# Patient Record
Sex: Female | Born: 1945 | Race: Black or African American | Hispanic: No | Smoking: Former smoker
Health system: Southern US, Community
[De-identification: ages and names within clinical notes are randomized; demographics above are authoritative.]

## PROBLEM LIST (undated history)

## (undated) DIAGNOSIS — Z860100 Personal history of colon polyps, unspecified: Secondary | ICD-10-CM

## (undated) DIAGNOSIS — F319 Bipolar disorder, unspecified: Secondary | ICD-10-CM

## (undated) DIAGNOSIS — J45909 Unspecified asthma, uncomplicated: Secondary | ICD-10-CM

## (undated) DIAGNOSIS — K5790 Diverticulosis of intestine, part unspecified, without perforation or abscess without bleeding: Secondary | ICD-10-CM

## (undated) DIAGNOSIS — F419 Anxiety disorder, unspecified: Secondary | ICD-10-CM

## (undated) DIAGNOSIS — E785 Hyperlipidemia, unspecified: Secondary | ICD-10-CM

## (undated) DIAGNOSIS — I10 Essential (primary) hypertension: Secondary | ICD-10-CM

## (undated) HISTORY — PX: FOOT SURGERY: SHX648

## (undated) HISTORY — PX: ABDOMINAL HYSTERECTOMY: SHX81

---

## 1982-11-04 HISTORY — PX: BREAST CYST ASPIRATION: SHX578

## 2003-12-09 ENCOUNTER — Other Ambulatory Visit: Payer: Self-pay

## 2004-12-31 ENCOUNTER — Ambulatory Visit: Payer: Self-pay | Admitting: Internal Medicine

## 2006-01-01 ENCOUNTER — Ambulatory Visit: Payer: Self-pay | Admitting: Internal Medicine

## 2006-12-15 ENCOUNTER — Ambulatory Visit: Payer: Self-pay | Admitting: Internal Medicine

## 2007-01-06 ENCOUNTER — Ambulatory Visit: Payer: Self-pay | Admitting: Internal Medicine

## 2007-11-20 ENCOUNTER — Ambulatory Visit: Payer: Self-pay | Admitting: Gastroenterology

## 2008-01-08 ENCOUNTER — Ambulatory Visit: Payer: Self-pay | Admitting: Internal Medicine

## 2009-01-09 ENCOUNTER — Ambulatory Visit: Payer: Self-pay | Admitting: Internal Medicine

## 2009-08-16 ENCOUNTER — Inpatient Hospital Stay: Payer: Self-pay | Admitting: Internal Medicine

## 2010-01-11 ENCOUNTER — Ambulatory Visit: Payer: Self-pay | Admitting: Internal Medicine

## 2010-07-02 ENCOUNTER — Ambulatory Visit: Payer: Self-pay

## 2010-11-27 ENCOUNTER — Ambulatory Visit: Payer: Self-pay | Admitting: Gastroenterology

## 2010-11-29 LAB — PATHOLOGY REPORT

## 2011-01-15 ENCOUNTER — Ambulatory Visit: Payer: Self-pay | Admitting: Internal Medicine

## 2011-04-08 ENCOUNTER — Emergency Department: Payer: Self-pay | Admitting: Unknown Physician Specialty

## 2011-04-10 ENCOUNTER — Ambulatory Visit: Payer: Self-pay | Admitting: Internal Medicine

## 2011-05-24 ENCOUNTER — Ambulatory Visit: Payer: Self-pay | Admitting: Family Medicine

## 2012-01-16 ENCOUNTER — Ambulatory Visit: Payer: Self-pay | Admitting: Internal Medicine

## 2013-01-19 ENCOUNTER — Ambulatory Visit: Payer: Self-pay | Admitting: Internal Medicine

## 2013-05-31 ENCOUNTER — Ambulatory Visit: Payer: Self-pay | Admitting: Anesthesiology

## 2013-06-03 ENCOUNTER — Ambulatory Visit: Payer: Self-pay | Admitting: Podiatry

## 2013-06-05 ENCOUNTER — Emergency Department: Payer: Self-pay | Admitting: Emergency Medicine

## 2013-06-05 LAB — COMPREHENSIVE METABOLIC PANEL
Anion Gap: 8 (ref 7–16)
BUN: 16 mg/dL (ref 7–18)
Bilirubin,Total: 0.4 mg/dL (ref 0.2–1.0)
Calcium, Total: 9.1 mg/dL (ref 8.5–10.1)
Co2: 26 mmol/L (ref 21–32)
EGFR (African American): >60
EGFR (Non-African Amer.): >60
Glucose: 86 mg/dL (ref 65–99)
Potassium: 3.4 mmol/L — ABNORMAL LOW (ref 3.5–5.1)
SGOT(AST): 19 U/L (ref 15–37)
Sodium: 140 mmol/L (ref 136–145)

## 2013-06-05 LAB — CBC
MCHC: 34.9 g/dL (ref 32.0–36.0)
MCV: 91 fL (ref 80–100)
RBC: 4.4 10*6/uL (ref 3.80–5.20)
RDW: 13.2 % (ref 11.5–14.5)
WBC: 14.1 10*3/uL — ABNORMAL HIGH (ref 3.6–11.0)

## 2013-06-05 LAB — URINALYSIS, COMPLETE
RBC,UR: 1287 /HPF (ref 0–5)
Specific Gravity: 1.011 (ref 1.003–1.030)
Squamous Epithelial: NONE SEEN
WBC UR: 73 /HPF (ref 0–5)

## 2013-06-07 LAB — URINE CULTURE

## 2014-01-21 ENCOUNTER — Ambulatory Visit: Payer: Self-pay | Admitting: Internal Medicine

## 2014-02-19 ENCOUNTER — Emergency Department: Payer: Self-pay | Admitting: Emergency Medicine

## 2014-03-27 ENCOUNTER — Emergency Department: Payer: Self-pay | Admitting: Emergency Medicine

## 2014-03-27 LAB — COMPREHENSIVE METABOLIC PANEL
ALBUMIN: 3.6 g/dL (ref 3.4–5.0)
ALK PHOS: 104 U/L
ANION GAP: 6 — AB (ref 7–16)
AST: 27 U/L (ref 15–37)
BUN: 17 mg/dL (ref 7–18)
Bilirubin,Total: 0.4 mg/dL (ref 0.2–1.0)
CHLORIDE: 105 mmol/L (ref 98–107)
Calcium, Total: 9 mg/dL (ref 8.5–10.1)
Co2: 27 mmol/L (ref 21–32)
Creatinine: 1 mg/dL (ref 0.60–1.30)
EGFR (African American): >60
GFR CALC NON AF AMER: 58 — AB
Glucose: 128 mg/dL — ABNORMAL HIGH (ref 65–99)
Osmolality: 279 (ref 275–301)
POTASSIUM: 3.6 mmol/L (ref 3.5–5.1)
SGPT (ALT): 28 U/L (ref 12–78)
SODIUM: 138 mmol/L (ref 136–145)
TOTAL PROTEIN: 7 g/dL (ref 6.4–8.2)

## 2014-03-27 LAB — CBC WITH DIFFERENTIAL/PLATELET
BASOS PCT: 0.7 %
Basophil #: 0 10*3/uL (ref 0.0–0.1)
EOS ABS: 0.4 10*3/uL (ref 0.0–0.7)
Eosinophil %: 6.2 %
HCT: 40.6 % (ref 35.0–47.0)
HGB: 13.7 g/dL (ref 12.0–16.0)
LYMPHS ABS: 1.7 10*3/uL (ref 1.0–3.6)
LYMPHS PCT: 25.4 %
MCH: 31.4 pg (ref 26.0–34.0)
MCHC: 33.8 g/dL (ref 32.0–36.0)
MCV: 93 fL (ref 80–100)
MONOS PCT: 11 %
Monocyte #: 0.7 x10 3/mm (ref 0.2–0.9)
NEUTROS PCT: 56.7 %
Neutrophil #: 3.8 10*3/uL (ref 1.4–6.5)
Platelet: 279 10*3/uL (ref 150–440)
RBC: 4.37 10*6/uL (ref 3.80–5.20)
RDW: 12.9 % (ref 11.5–14.5)
WBC: 6.8 10*3/uL (ref 3.6–11.0)

## 2014-03-27 LAB — URINALYSIS, COMPLETE
Bilirubin,UR: NEGATIVE
Blood: NEGATIVE
Glucose,UR: NEGATIVE mg/dL (ref 0–75)
KETONE: NEGATIVE
Nitrite: NEGATIVE
Ph: 6 (ref 4.5–8.0)
Protein: NEGATIVE
RBC,UR: <1 /HPF (ref 0–5)
SPECIFIC GRAVITY: 1.002 (ref 1.003–1.030)

## 2014-03-29 ENCOUNTER — Emergency Department: Payer: Self-pay | Admitting: Emergency Medicine

## 2014-03-30 ENCOUNTER — Emergency Department: Payer: Self-pay | Admitting: Emergency Medicine

## 2014-03-30 LAB — BASIC METABOLIC PANEL
Anion Gap: 5 — ABNORMAL LOW (ref 7–16)
BUN: 20 mg/dL — AB (ref 7–18)
CALCIUM: 9.3 mg/dL (ref 8.5–10.1)
Chloride: 111 mmol/L — ABNORMAL HIGH (ref 98–107)
Co2: 25 mmol/L (ref 21–32)
Creatinine: 0.84 mg/dL (ref 0.60–1.30)
EGFR (Non-African Amer.): >60
Glucose: 142 mg/dL — ABNORMAL HIGH (ref 65–99)
Osmolality: 286 (ref 275–301)
POTASSIUM: 3.6 mmol/L (ref 3.5–5.1)
Sodium: 141 mmol/L (ref 136–145)

## 2014-03-30 LAB — CBC
HCT: 41.8 % (ref 35.0–47.0)
HGB: 14.2 g/dL (ref 12.0–16.0)
MCH: 31.6 pg (ref 26.0–34.0)
MCHC: 34.1 g/dL (ref 32.0–36.0)
MCV: 93 fL (ref 80–100)
Platelet: 290 10*3/uL (ref 150–440)
RBC: 4.5 10*6/uL (ref 3.80–5.20)
RDW: 13 % (ref 11.5–14.5)
WBC: 10.4 10*3/uL (ref 3.6–11.0)

## 2014-03-30 LAB — TROPONIN I: Troponin-I: <0.02 ng/mL

## 2014-09-05 ENCOUNTER — Ambulatory Visit: Payer: Self-pay | Admitting: Gastroenterology

## 2015-03-01 ENCOUNTER — Ambulatory Visit
Admit: 2015-03-01 | Disposition: A | Discharge status: Home / Self Care | Payer: Self-pay | Attending: Internal Medicine | Admitting: Internal Medicine

## 2015-05-28 IMAGING — CT CT ABD-PELV W/O CM
1 of 2 series · 15 of 32 positions shown, 19 images · non-contrast
Comparison: none

REASON FOR EXAM: (1) flank pain, hematuria; (2) flank pain, hematuria
COMMENTS:

PROCEDURE:     CT  - CT ABDOMEN AND PELVIS W[DATE] [DATE]
RESULT:
TECHNIQUE: Helical noncontrast 3 mm sections were obtained from the lung
bases through the pubic symphysis.

[Series 2: 3mm soft tissue · axial · 0.79mm/px · z∈[-434,-48]mm · 15 of 141 slices shown, 19 images]
[im 6/141  soft-tissue]
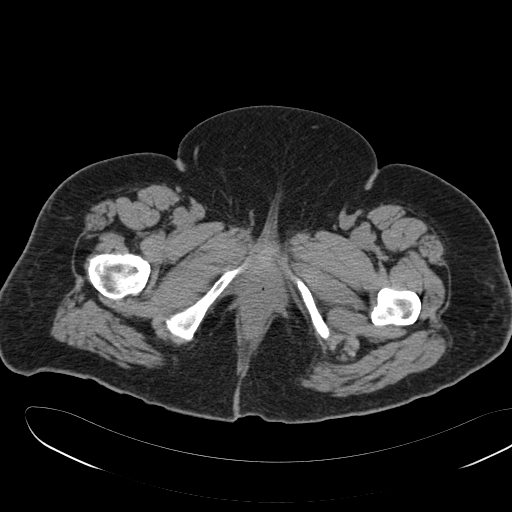
[im 6/141  bone]
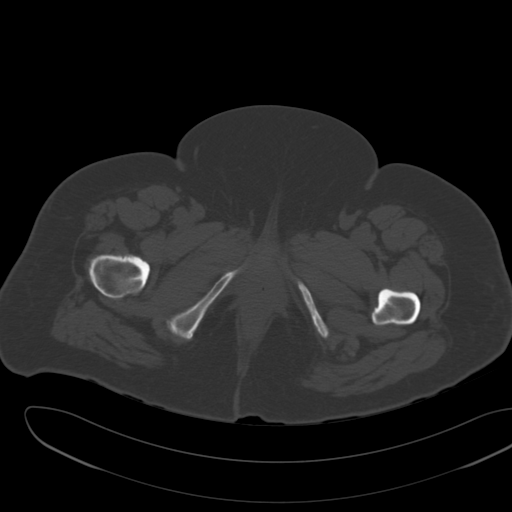
[im 18/141  soft-tissue]
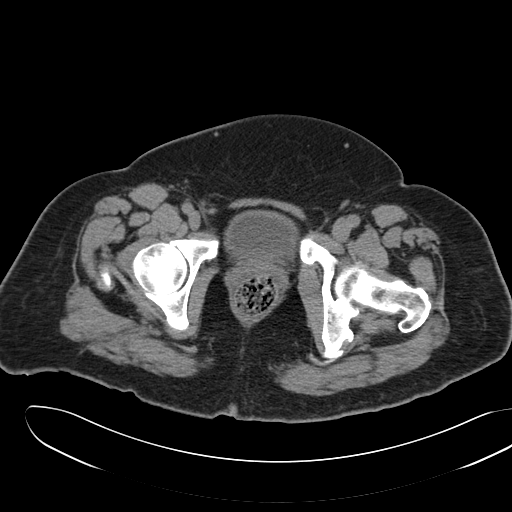
[im 30/141  soft-tissue]
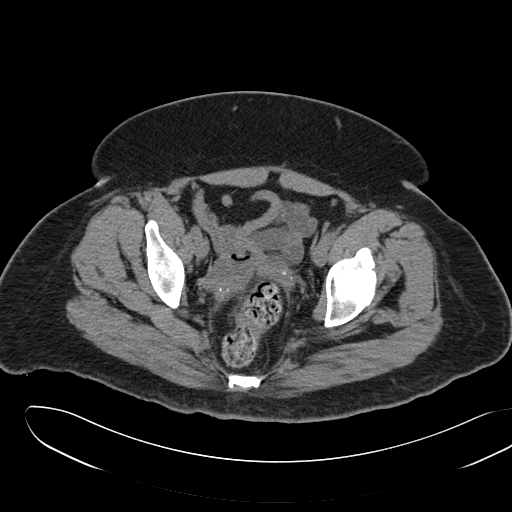
[im 41/141  soft-tissue]
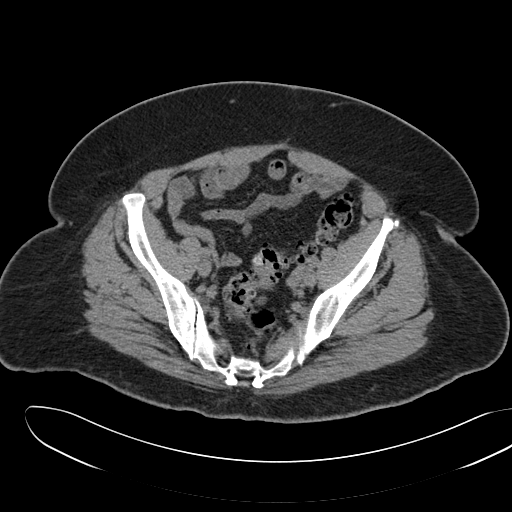
[im 47/141  soft-tissue]
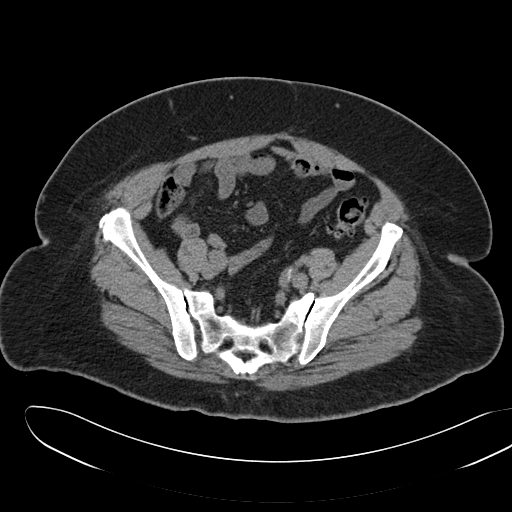
[im 59/141  soft-tissue]
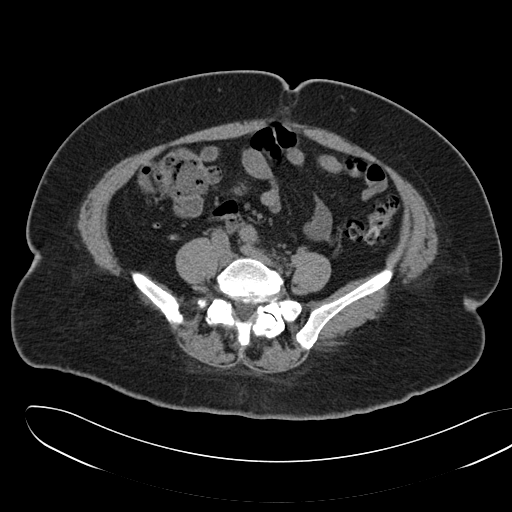
[im 71/141  soft-tissue]
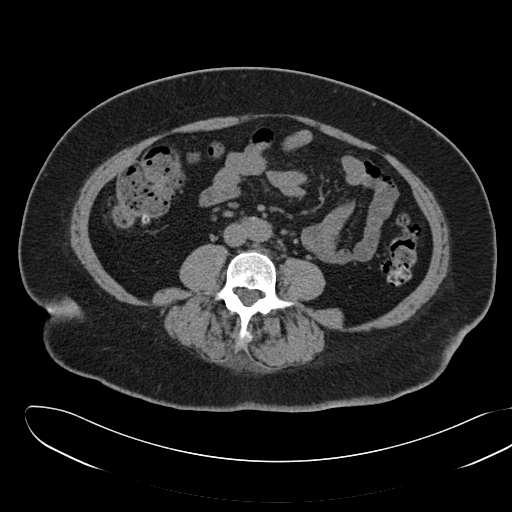
[im 82/141  soft-tissue]
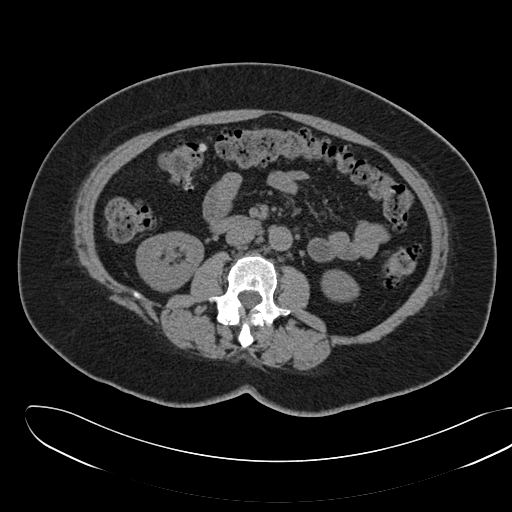
[im 94/141  soft-tissue]
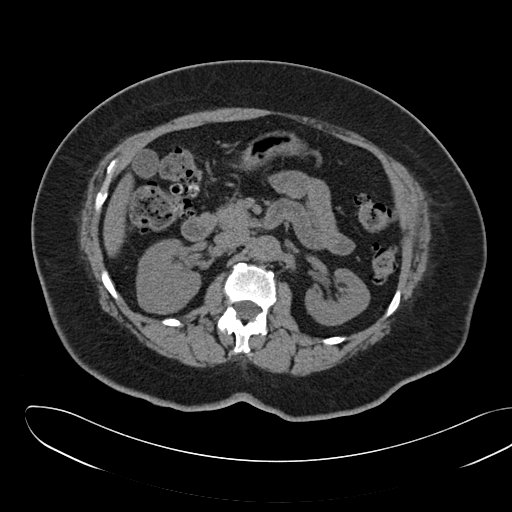
[im 94/141  bone]
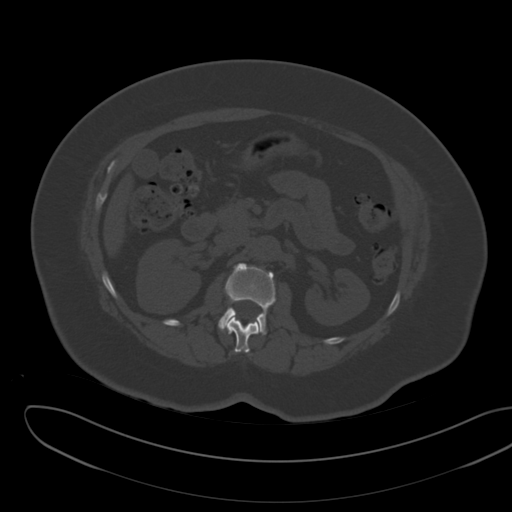
[im 100/141  soft-tissue]
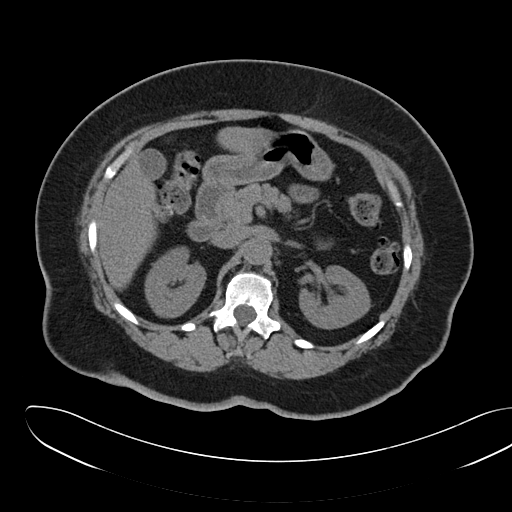
[im 111/141  soft-tissue]
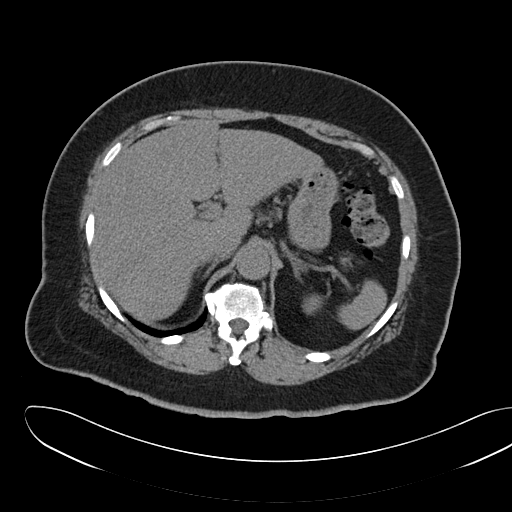
[im 117/141  lung]
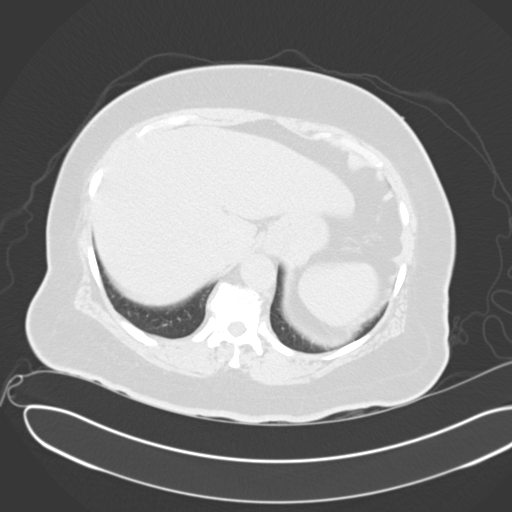
[im 123/141  soft-tissue]
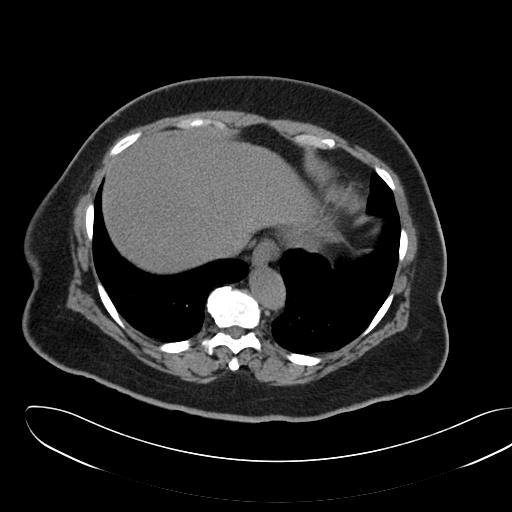
[im 123/141  lung]
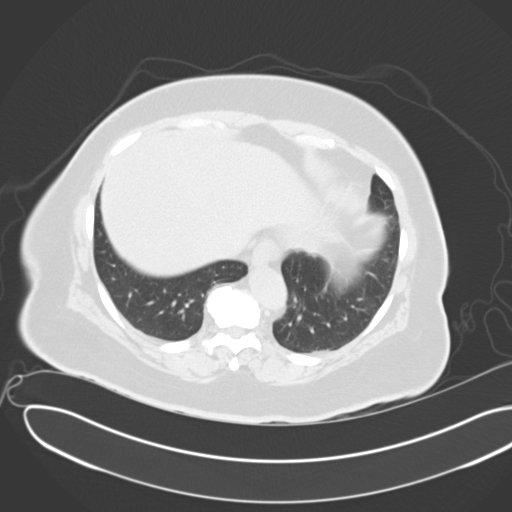
[im 129/141  lung]
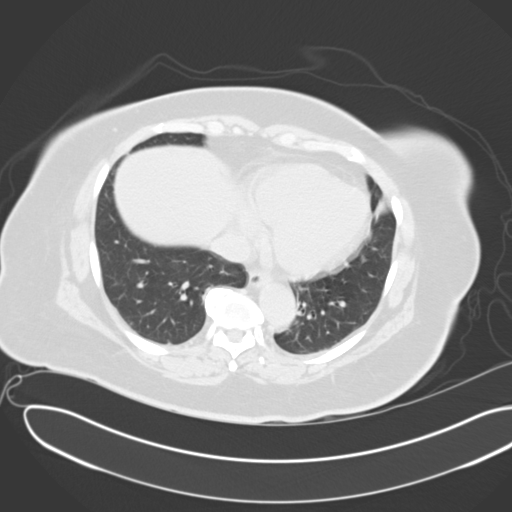
[im 135/141  soft-tissue]
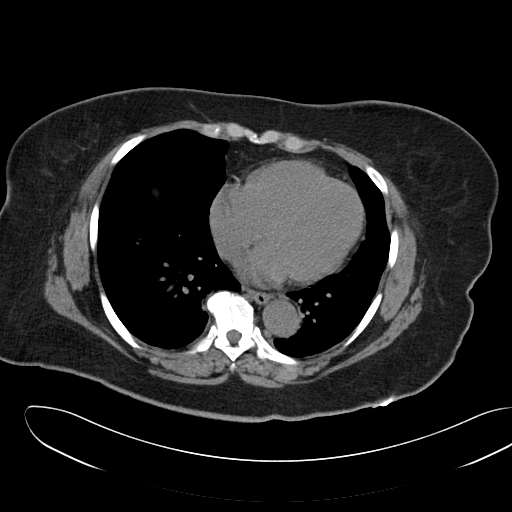
[im 135/141  lung]
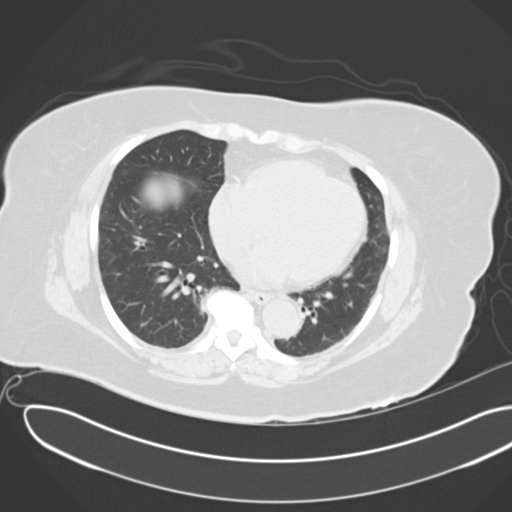

[15 of 32 positions shown; findings below may reference images not displayed]

Within the base of the lingula, a mild area of increased density is
appreciated. Differential considerations are atelectasis versus infiltrate.
A small, amorphous nodular component is appreciated and surveillance
evaluation is recommended.

Noncontrast evaluation of the liver demonstrates mild diffuse low
attenuating architecture. The spleen, adrenals, and pancreas are
unremarkable. Evaluation of the kidneys demonstrates no evidence of
hydronephrosis, hydroureter or ureterolithiasis. There is no evidence of
bowel obstruction, enteritis, colitis or diverticulitis. There is
diverticulosis within the colon. The urinary bladder is partially
decompressed though there is mild diffuse bladder wall thickening.

There is no evidence of bowel obstruction, masses, adenopathy, free fluid or
loculated fluid collections within the limitations of a noncontrast CT. The
appendix is identified and is unremarkable. There is no evidence of an
abdominal aortic aneurysm.
IMPRESSION: 1.  No CT evidence of renal calculus disease or obstructive uropathy.
2.  Diverticulosis without evidence of diverticulitis.
3.  Findings likely reflecting hepatic steatosis.
4.  Moderate amount of fecal retention.
5.  Dr. Jean of the Emergency Department was informed of these findings
via a preliminary faxed report.

## 2016-06-10 ENCOUNTER — Other Ambulatory Visit: Payer: Self-pay | Admitting: Internal Medicine

## 2016-06-10 DIAGNOSIS — Z1231 Encounter for screening mammogram for malignant neoplasm of breast: Secondary | ICD-10-CM

## 2016-06-24 ENCOUNTER — Ambulatory Visit
Admission: RE | Admit: 2016-06-24 | Discharge: 2016-06-24 | Disposition: A | Discharge status: Home / Self Care | Payer: Medicare Other | Source: Ambulatory Visit | Attending: Internal Medicine | Admitting: Internal Medicine

## 2016-06-24 ENCOUNTER — Other Ambulatory Visit: Payer: Self-pay | Admitting: Internal Medicine

## 2016-06-24 DIAGNOSIS — Z1231 Encounter for screening mammogram for malignant neoplasm of breast: Secondary | ICD-10-CM

## 2017-06-30 ENCOUNTER — Other Ambulatory Visit: Payer: Self-pay | Admitting: Internal Medicine

## 2017-06-30 DIAGNOSIS — Z1231 Encounter for screening mammogram for malignant neoplasm of breast: Secondary | ICD-10-CM

## 2017-07-16 ENCOUNTER — Ambulatory Visit
Admission: RE | Admit: 2017-07-16 | Discharge: 2017-07-16 | Disposition: A | Discharge status: Home / Self Care | Payer: Medicare Other | Source: Ambulatory Visit | Attending: Internal Medicine | Admitting: Internal Medicine

## 2017-07-16 DIAGNOSIS — Z1231 Encounter for screening mammogram for malignant neoplasm of breast: Secondary | ICD-10-CM

## 2017-11-22 ENCOUNTER — Other Ambulatory Visit: Payer: Self-pay

## 2017-11-22 ENCOUNTER — Emergency Department
Admission: EM | Admit: 2017-11-22 | Discharge: 2017-11-22 | Disposition: A | Discharge status: Home / Self Care | Payer: Medicare Other | Attending: Emergency Medicine | Admitting: Emergency Medicine

## 2017-11-22 ENCOUNTER — Encounter: Payer: Self-pay | Admitting: Emergency Medicine

## 2017-11-22 DIAGNOSIS — I10 Essential (primary) hypertension: Secondary | ICD-10-CM | POA: Insufficient documentation

## 2017-11-22 DIAGNOSIS — N3001 Acute cystitis with hematuria: Secondary | ICD-10-CM | POA: Insufficient documentation

## 2017-11-22 DIAGNOSIS — R300 Dysuria: Secondary | ICD-10-CM | POA: Diagnosis present

## 2017-11-22 HISTORY — DX: Essential (primary) hypertension: I10

## 2017-11-22 LAB — URINALYSIS, COMPLETE (UACMP) WITH MICROSCOPIC: Specific Gravity, Urine: 1.006 (ref 1.005–1.030)

## 2017-11-22 MED ORDER — FLUCONAZOLE 150 MG PO TABS: 150.0000 mg | ORAL_TABLET | Freq: Once | ORAL | 0 refills | Status: AC

## 2017-11-22 MED ORDER — LIDOCAINE HCL (PF) 1 % IJ SOLN
2.1000 mL | Freq: Once | INTRAMUSCULAR | Status: AC
  Administered 2017-11-22: 2.1 mL
  Filled 2017-11-22: qty 5

## 2017-11-22 MED ORDER — PHENAZOPYRIDINE HCL 95 MG PO TABS: 95.0000 mg | ORAL_TABLET | Freq: Three times a day (TID) | ORAL | 0 refills | Status: DC | PRN

## 2017-11-22 MED ORDER — LEVOFLOXACIN 750 MG PO TABS
750.0000 mg | ORAL_TABLET | Freq: Once | ORAL | Status: AC
  Administered 2017-11-22: 750 mg via ORAL
  Filled 2017-11-22: qty 1

## 2017-11-22 MED ORDER — CEFTRIAXONE SODIUM 1 G IJ SOLR
1.0000 g | Freq: Once | INTRAMUSCULAR | Status: AC
  Administered 2017-11-22: 1 g via INTRAMUSCULAR
  Filled 2017-11-22: qty 10

## 2017-11-22 MED ORDER — LEVOFLOXACIN 750 MG PO TABS: 750.0000 mg | ORAL_TABLET | Freq: Every day | ORAL | 0 refills | Status: AC

## 2017-11-22 NOTE — ED Notes (Signed)
Pt says she thinks she has a UTI; admits to hematuria; ambulatory with steady gait;

## 2017-11-22 NOTE — ED Notes (Signed)
Pt tried to urinate but was unable to give enough for UA.

## 2017-11-22 NOTE — ED Provider Notes (Signed)
[facility] Emergency Department Provider Note  ____________________________________________  Time seen: Approximately 9:48 PM  I have reviewed the triage vital signs and the nursing notes.   HISTORY  Chief Complaint Urinary Tract Infection    HPI Tara Cabrera is a 72 y.o. female who presents the emergency department complaining of suprapubic, lower back pain, dysuria, polyuria, hematuria.  Patient reports that she has an infrequent history of UTIs but the past UTIs presented in similar symptoms.  Patient denies any history of kidney stone.  She reports that lower back pain is in the "very lower back"but denies frank flank pain.  Pain is bilaterally.  Patient denies any frank abdominal pain, nausea vomiting, diarrhea or constipation.  Patient is not tried any medications for this complaint prior to arrival.  Symptoms have been ongoing for several days.  Patient reports that she does have frank hematuria, dysuria is described as burning, lower back and suprapubic pain is described as a burning/sensation.  Past Medical History:  Diagnosis Date  . Hypertension     There are no active problems to display for this patient.   Past Surgical History:  Procedure Laterality Date  . ABDOMINAL HYSTERECTOMY    . BREAST CYST ASPIRATION Right 1984  . FOOT SURGERY      Prior to Admission medications   Medication Sig Start Date End Date Taking? Authorizing Provider  levofloxacin (LEVAQUIN) 750 MG tablet Take 1 tablet (750 mg total) by mouth daily for 7 days. 11/22/17 11/29/17  Kealey Kemmer, Charline Bills, PA-C  phenazopyridine (PYRIDIUM) 95 MG tablet Take 1 tablet (95 mg total) by mouth 3 (three) times daily as needed for pain. 11/22/17   Kharis Lapenna, Charline Bills, PA-C    Allergies Codeine and Penicillins  Family History  Problem Relation Age of Onset  . Breast cancer Mother 27  . Breast cancer Maternal Aunt 68    Social History Social History   Tobacco Use  . Smoking  status: Never Smoker  . Smokeless tobacco: Never Used  Substance Use Topics  . Alcohol use: Yes    Frequency: Never    Comment: rarely  . Drug use: No     Review of Systems  Constitutional: No fever/chills Eyes: No visual changes. No discharge ENT: No upper respiratory complaints. Cardiovascular: no chest pain. Respiratory: no cough. No SOB. Gastrointestinal: Positive for suprapubic pain.  No abdominal pain.  No nausea, no vomiting.  No diarrhea.  No constipation. Genitourinary: As noted for dysuria, hematuria, polyuria Musculoskeletal: Negative for musculoskeletal pain. Skin: Negative for rash, abrasions, lacerations, ecchymosis. Neurological: Negative for headaches, focal weakness or numbness. 10-point ROS otherwise negative.  ____________________________________________   PHYSICAL EXAM:  VITAL SIGNS: ED Triage Vitals [11/22/17 2039]  Enc Vitals Group     BP (!) 147/100     Pulse Rate 70     Resp 20     Temp 98.4 F (36.9 C)     Temp Source Oral     SpO2 98 %     Weight 183 lb (83 kg)     Height 5\' 2"  (1.575 m)     Head Circumference      Peak Flow      Pain Score 8     Pain Loc      Pain Edu?      Excl. in [place]?      Constitutional: Alert and oriented. Well appearing and in no acute distress. Eyes: Conjunctivae are normal. PERRL. EOMI. Head: Atraumatic. Neck: No stridor.  Cardiovascular: Normal rate, regular rhythm. Normal S1 and S2.  Good peripheral circulation. Respiratory: Normal respiratory effort without tachypnea or retractions. Lungs CTAB. Good air entry to the bases with no decreased or absent breath sounds. Gastrointestinal: Bowel sounds 4 quadrants. Soft and nontender to palpation. No guarding or rigidity. No palpable masses. No distention. No CVA tenderness. Musculoskeletal: Full range of motion to all extremities. No gross deformities appreciated. Neurologic:  Normal speech and language. No gross focal neurologic deficits are appreciated.   Skin:  Skin is warm, dry and intact. No rash noted. Psychiatric: Mood and affect are normal. Speech and behavior are normal. Patient exhibits appropriate insight and judgement.   ____________________________________________   LABS (all labs ordered are listed, but only abnormal results are displayed)  Labs Reviewed  URINALYSIS, COMPLETE (UACMP) WITH MICROSCOPIC - Abnormal; Notable for the following components:      Result Value   Color, Urine RED (*)    APPearance HAZY (*)    Glucose, UA   (*)    Value: TEST NOT REPORTED DUE TO COLOR INTERFERENCE OF URINE PIGMENT   Hgb urine dipstick   (*)    Value: TEST NOT REPORTED DUE TO COLOR INTERFERENCE OF URINE PIGMENT   Bilirubin Urine   (*)    Value: TEST NOT REPORTED DUE TO COLOR INTERFERENCE OF URINE PIGMENT   Ketones, ur   (*)    Value: TEST NOT REPORTED DUE TO COLOR INTERFERENCE OF URINE PIGMENT   Protein, ur   (*)    Value: TEST NOT REPORTED DUE TO COLOR INTERFERENCE OF URINE PIGMENT   Nitrite   (*)    Value: TEST NOT REPORTED DUE TO COLOR INTERFERENCE OF URINE PIGMENT   Leukocytes, UA   (*)    Value: TEST NOT REPORTED DUE TO COLOR INTERFERENCE OF URINE PIGMENT   All other components within normal limits   ____________________________________________  EKG   ____________________________________________  RADIOLOGY   No results found.  ____________________________________________    PROCEDURES  Procedure(s) performed:    Procedures    Medications  cefTRIAXone (ROCEPHIN) injection 1 g (1 g Intramuscular Given 11/22/17 2252)  lidocaine (PF) (XYLOCAINE) 1 % injection 2.1 mL (2.1 mLs Infiltration Given 11/22/17 2257)  levofloxacin (LEVAQUIN) tablet 750 mg (750 mg Oral Given 11/22/17 2252)     ____________________________________________   INITIAL IMPRESSION / ASSESSMENT AND PLAN / ED COURSE  Pertinent labs & imaging results that were available during my care of the patient were reviewed by me and considered in  my medical decision making (see chart for details).  Review of the [place] CSRS was performed in accordance of the [place] prior to dispensing any controlled drugs.     Patient's diagnosis is consistent with cystitis with hematuria.  Patient presented with dysuria, polyuria, hematuria.  No history of kidney stones.  Initial differential included cystitis, UTI, pyelonephritis, nephrolithiasis.  Patient's urinalysis was hazy and due to color, unable to be read.  At this time, patient states that symptoms are consistent with previous UTI.  At this point, there is no clinical indication of fevers or chills, CVA tenderness, nausea, vomiting, inability to maintain oral hydration or medications to consider admission.  Patient reports that the pain is more of a cramping and burning sensation as well as it being bilaterally.  I think the chance of nephrolithiasis is very low at this time.  Patient will be treated with ceftriaxone and Levaquin for likely UTI.  Patient does have a history to penicillins but she states  that she has never had a rash and/or anaphylaxis.  Patient tolerated Levaquin and ceftriaxone in the emergency department well.  Patient is given strict instructions that if symptoms have not improved at all in the next 2 days to return to the emergency department for further evaluation of dysuria and hematuria.  Patient verbalizes understanding of same.. Patient will be discharged home with prescriptions for Levaquin, Pyridium, Diflucan to take after antibiotic use. Patient is to follow up with primary care as needed or otherwise directed. Patient is given ED precautions to return to the ED for any worsening or new symptoms.     ____________________________________________  FINAL CLINICAL IMPRESSION(S) / ED DIAGNOSES  Final diagnoses:  Acute cystitis with hematuria      NEW MEDICATIONS STARTED DURING THIS VISIT:  ED Discharge Orders        Ordered    levofloxacin (LEVAQUIN) 750 MG tablet  Daily      11/22/17 2230    phenazopyridine (PYRIDIUM) 95 MG tablet  3 times daily PRN     11/22/17 2230    fluconazole (DIFLUCAN) 150 MG tablet   Once     11/22/17 2230          This chart was dictated using voice recognition software/Dragon. Despite best efforts to proofread, errors can occur which can change the meaning. Any change was purely unintentional.    Darletta Moll, PA-C 11/23/17 0015    Earleen Newport, MD 11/26/17 [phone]

## 2017-11-22 NOTE — ED Triage Notes (Signed)
Pt arrives ambulatory to triage with c/o UTI. Pt reports that she had a sinus infection on 11/06/17 and was given antibiotics at the time. Pt reports that she feel like she has a bad UTI at this time. Pt reports painful urination and urge to pee and reports that her urine is bloody at this time. Pt is in NAD.

## 2017-12-04 ENCOUNTER — Other Ambulatory Visit: Payer: Self-pay | Admitting: Internal Medicine

## 2017-12-04 DIAGNOSIS — M25512 Pain in left shoulder: Secondary | ICD-10-CM

## 2017-12-15 ENCOUNTER — Ambulatory Visit
Admission: RE | Admit: 2017-12-15 | Discharge: 2017-12-15 | Disposition: A | Discharge status: Home / Self Care | Payer: Medicare Other | Source: Ambulatory Visit | Attending: Internal Medicine | Admitting: Internal Medicine

## 2017-12-15 DIAGNOSIS — M25512 Pain in left shoulder: Secondary | ICD-10-CM | POA: Diagnosis present

## 2018-06-15 ENCOUNTER — Other Ambulatory Visit: Payer: Self-pay | Admitting: Internal Medicine

## 2018-06-15 DIAGNOSIS — Z1231 Encounter for screening mammogram for malignant neoplasm of breast: Secondary | ICD-10-CM

## 2018-07-21 ENCOUNTER — Ambulatory Visit
Admission: RE | Admit: 2018-07-21 | Discharge: 2018-07-21 | Disposition: A | Discharge status: Home / Self Care | Payer: Medicare Other | Source: Ambulatory Visit | Attending: Internal Medicine | Admitting: Internal Medicine

## 2018-07-21 DIAGNOSIS — Z1231 Encounter for screening mammogram for malignant neoplasm of breast: Secondary | ICD-10-CM | POA: Insufficient documentation

## 2018-12-10 ENCOUNTER — Telehealth: Payer: Self-pay | Admitting: *Deleted

## 2018-12-10 DIAGNOSIS — Z87891 Personal history of nicotine dependence: Secondary | ICD-10-CM

## 2018-12-10 DIAGNOSIS — Z122 Encounter for screening for malignant neoplasm of respiratory organs: Secondary | ICD-10-CM

## 2018-12-10 NOTE — Telephone Encounter (Signed)
Received referral for initial lung cancer screening scan. Contacted patient and obtained smoking history,(former, quit 2010, 35.25 pack year) as well as answering questions related to screening process. Patient denies signs of lung cancer such as weight loss or hemoptysis. Patient denies comorbidity that would prevent curative treatment if lung cancer were found. Patient is scheduled for shared decision making visit and CT scan on 12/22/18 at 1130.

## 2018-12-21 ENCOUNTER — Telehealth: Payer: Self-pay | Admitting: *Deleted

## 2018-12-21 NOTE — Telephone Encounter (Signed)
Called pt to remind her of her appt for ldct screening on 12-22-2018 @1130  voiced understanding.

## 2018-12-22 ENCOUNTER — Encounter (INDEPENDENT_AMBULATORY_CARE_PROVIDER_SITE_OTHER): Payer: Self-pay

## 2018-12-22 ENCOUNTER — Encounter: Payer: Self-pay | Admitting: Nurse Practitioner

## 2018-12-22 ENCOUNTER — Inpatient Hospital Stay: Payer: Medicare Other | Attending: Nurse Practitioner | Admitting: Nurse Practitioner

## 2018-12-22 ENCOUNTER — Ambulatory Visit
Admission: RE | Admit: 2018-12-22 | Discharge: 2018-12-22 | Disposition: A | Discharge status: Home / Self Care | Payer: Medicare Other | Source: Ambulatory Visit | Attending: Nurse Practitioner | Admitting: Nurse Practitioner

## 2018-12-22 DIAGNOSIS — Z122 Encounter for screening for malignant neoplasm of respiratory organs: Secondary | ICD-10-CM

## 2018-12-22 DIAGNOSIS — Z87891 Personal history of nicotine dependence: Secondary | ICD-10-CM

## 2018-12-22 NOTE — Progress Notes (Signed)
In accordance with CMS guidelines, patient has met eligibility criteria including age, absence of signs or symptoms of lung cancer.  Social History   Tobacco Use  . Smoking status: Former Smoker    Packs/day: 0.75    Years: 47.00    Pack years: 35.25    Types: Cigarettes    Last attempt to quit: 2010    Years since quitting: 10.1  . Smokeless tobacco: Never Used  Substance Use Topics  . Alcohol use: Yes    Frequency: Never    Comment: rarely  . Drug use: No      A shared decision-making session was conducted prior to the performance of CT scan. This includes one or more decision aids, includes benefits and harms of screening, follow-up diagnostic testing, over-diagnosis, false positive rate, and total radiation exposure.   Counseling on the importance of adherence to annual lung cancer LDCT screening, impact of co-morbidities, and ability or willingness to undergo diagnosis and treatment is imperative for compliance of the program.   Counseling on the importance of continued smoking cessation for former smokers; the importance of smoking cessation for current smokers, and information about tobacco cessation interventions have been given to patient including [place] [facility]uit [facility] and 1800 quit [place] programs.   Written order for lung cancer screening with LDCT has been given to the patient and any and all questions have been answered to the best of my abilities.    Yearly follow up will be coordinated by Shawn Perkins, Thoracic Navigator.  Lauren Allen, DNP, AGNP-C [facility] [phone] (work cell) [phone] (office) 12/22/18 12:09 PM   

## 2018-12-23 ENCOUNTER — Encounter: Payer: Self-pay | Admitting: *Deleted

## 2018-12-29 ENCOUNTER — Encounter: Payer: Self-pay | Admitting: *Deleted

## 2019-06-16 ENCOUNTER — Other Ambulatory Visit: Payer: Self-pay | Admitting: Internal Medicine

## 2019-06-16 DIAGNOSIS — Z1231 Encounter for screening mammogram for malignant neoplasm of breast: Secondary | ICD-10-CM

## 2019-07-23 ENCOUNTER — Other Ambulatory Visit: Payer: Self-pay

## 2019-07-23 ENCOUNTER — Ambulatory Visit
Admission: RE | Admit: 2019-07-23 | Discharge: 2019-07-23 | Disposition: A | Discharge status: Home / Self Care | Payer: Medicare Other | Source: Ambulatory Visit | Attending: Internal Medicine | Admitting: Internal Medicine

## 2019-07-23 DIAGNOSIS — Z1231 Encounter for screening mammogram for malignant neoplasm of breast: Secondary | ICD-10-CM | POA: Diagnosis present

## 2019-12-16 ENCOUNTER — Telehealth: Payer: Self-pay | Admitting: *Deleted

## 2019-12-16 NOTE — Telephone Encounter (Signed)
Left message for patient to notify them that it is time to schedule annual low dose lung cancer screening CT scan. Instructed patient to call back to verify information prior to the scan being scheduled.  

## 2019-12-17 ENCOUNTER — Ambulatory Visit: Payer: Medicare Other | Attending: Internal Medicine

## 2019-12-17 DIAGNOSIS — Z23 Encounter for immunization: Secondary | ICD-10-CM | POA: Insufficient documentation

## 2019-12-17 NOTE — Progress Notes (Signed)
   Covid-19 Vaccination Clinic  Name:  Tara Cabrera    MRN: [id number] DOB: Jul 16, 1946  12/17/2019  [name] was observed post Covid-19 immunization for 15 minutes without incidence. She was provided with Vaccine Information Sheet and instruction to access the V-Safe system.   [name] was instructed to call 911 with any severe reactions post vaccine: Marland Kitchen Difficulty breathing  . Swelling of your face and throat  . A fast heartbeat  . A bad rash all over your body  . Dizziness and weakness    Immunizations Administered    Name Date Dose VIS Date Route   Moderna COVID-19 Vaccine 12/17/2019 12:25 PM 0.5 mL 10/05/2019 Intramuscular   Manufacturer: Moderna   Lot: [id number]   [id number]

## 2019-12-21 ENCOUNTER — Telehealth: Payer: Self-pay | Admitting: *Deleted

## 2019-12-21 DIAGNOSIS — Z87891 Personal history of nicotine dependence: Secondary | ICD-10-CM

## 2019-12-21 NOTE — Telephone Encounter (Signed)
Patient has been notified that annual lung cancer screening low dose CT scan is due currently or will be in near future. Confirmed that patient is within the age range of 55-77, and asymptomatic, (no signs or symptoms of lung cancer). Patient denies illness that would prevent curative treatment for lung cancer if found. Verified smoking history, (former, quit 2010, 35.25 pack year). The shared decision making visit was done 12/22/18. Patient is agreeable for CT scan being scheduled.

## 2019-12-28 ENCOUNTER — Other Ambulatory Visit: Payer: Self-pay

## 2019-12-28 ENCOUNTER — Ambulatory Visit
Admission: RE | Admit: 2019-12-28 | Discharge: 2019-12-28 | Disposition: A | Discharge status: Home / Self Care | Payer: Medicare Other | Source: Ambulatory Visit | Attending: Oncology | Admitting: Oncology

## 2019-12-28 DIAGNOSIS — Z87891 Personal history of nicotine dependence: Secondary | ICD-10-CM | POA: Diagnosis present

## 2019-12-31 ENCOUNTER — Encounter: Payer: Self-pay | Admitting: *Deleted

## 2020-01-13 ENCOUNTER — Other Ambulatory Visit: Payer: Self-pay | Admitting: Internal Medicine

## 2020-01-13 DIAGNOSIS — M5412 Radiculopathy, cervical region: Secondary | ICD-10-CM

## 2020-01-17 ENCOUNTER — Ambulatory Visit: Payer: Medicare Other | Attending: Internal Medicine

## 2020-01-17 DIAGNOSIS — Z23 Encounter for immunization: Secondary | ICD-10-CM

## 2020-01-17 NOTE — Progress Notes (Signed)
   Covid-19 Vaccination Clinic  Name:  EMBREE HAHNER    MRN: [id number] DOB: [birth year]  01/17/2020  Ms. Monacelli was observed post Covid-19 immunization for 15 minutes without incident. She was provided with Vaccine Information Sheet and instruction to access the V-Safe system.   Ms. Moffo was instructed to call 911 with any severe reactions post vaccine: Marland Kitchen Difficulty breathing  . Swelling of face and throat  . A fast heartbeat  . A bad rash all over body  . Dizziness and weakness   Immunizations Administered    Name Date Dose VIS Date Route   Moderna COVID-19 Vaccine 01/17/2020 12:04 PM 0.5 mL 10/05/2019 Intramuscular   Manufacturer: Moderna   Lot: [id number]   [place]:[id number]

## 2020-01-27 ENCOUNTER — Ambulatory Visit
Admission: RE | Admit: 2020-01-27 | Discharge: 2020-01-27 | Disposition: A | Discharge status: Home / Self Care | Payer: Medicare Other | Source: Ambulatory Visit | Attending: Internal Medicine | Admitting: Internal Medicine

## 2020-01-27 ENCOUNTER — Other Ambulatory Visit: Payer: Self-pay

## 2020-01-27 DIAGNOSIS — M5412 Radiculopathy, cervical region: Secondary | ICD-10-CM | POA: Diagnosis present

## 2020-01-27 LAB — POCT I-STAT CREATININE: Creatinine, Ser: 0.9 mg/dL (ref 0.44–1.00)

## 2020-01-27 MED ORDER — GADOBUTROL 1 MMOL/ML IV SOLN
7.0000 mL | Freq: Once | INTRAVENOUS | Status: AC | PRN
  Administered 2020-01-27: 7 mL via INTRAVENOUS

## 2020-02-16 ENCOUNTER — Other Ambulatory Visit
Admission: RE | Admit: 2020-02-16 | Discharge: 2020-02-16 | Disposition: A | Discharge status: Home / Self Care | Payer: Medicare Other | Source: Ambulatory Visit | Attending: General Surgery | Admitting: General Surgery

## 2020-02-16 ENCOUNTER — Other Ambulatory Visit: Payer: Self-pay

## 2020-02-16 DIAGNOSIS — Z20822 Contact with and (suspected) exposure to covid-19: Secondary | ICD-10-CM | POA: Diagnosis not present

## 2020-02-16 DIAGNOSIS — Z01812 Encounter for preprocedural laboratory examination: Secondary | ICD-10-CM | POA: Insufficient documentation

## 2020-02-16 LAB — SARS CORONAVIRUS 2 (TAT 6-24 HRS): SARS Coronavirus 2: NEGATIVE

## 2020-02-18 ENCOUNTER — Encounter: Admission: RE | Payer: Self-pay | Source: Home / Self Care

## 2020-02-18 ENCOUNTER — Ambulatory Visit: Admission: RE | Admit: 2020-02-18 | Payer: Medicare Other | Source: Home / Self Care | Admitting: General Surgery

## 2020-02-18 SURGERY — ESOPHAGOGASTRODUODENOSCOPY (EGD) WITH PROPOFOL
Anesthesia: General

## 2020-05-30 ENCOUNTER — Other Ambulatory Visit: Payer: Self-pay

## 2020-05-30 ENCOUNTER — Other Ambulatory Visit
Admission: RE | Admit: 2020-05-30 | Discharge: 2020-05-30 | Disposition: A | Discharge status: Home / Self Care | Payer: Medicare Other | Source: Ambulatory Visit | Attending: Internal Medicine | Admitting: Internal Medicine

## 2020-05-30 DIAGNOSIS — Z20822 Contact with and (suspected) exposure to covid-19: Secondary | ICD-10-CM | POA: Insufficient documentation

## 2020-05-30 DIAGNOSIS — Z01812 Encounter for preprocedural laboratory examination: Secondary | ICD-10-CM | POA: Insufficient documentation

## 2020-05-30 LAB — SARS CORONAVIRUS 2 (TAT 6-24 HRS): SARS Coronavirus 2: NEGATIVE

## 2020-05-31 ENCOUNTER — Encounter: Payer: Self-pay | Admitting: Internal Medicine

## 2020-06-01 ENCOUNTER — Encounter: Payer: Self-pay | Admitting: Internal Medicine

## 2020-06-01 ENCOUNTER — Encounter
Admission: RE | Disposition: A | Discharge status: Home / Self Care | Payer: Self-pay | Source: Home / Self Care | Attending: Internal Medicine

## 2020-06-01 ENCOUNTER — Other Ambulatory Visit: Payer: Self-pay

## 2020-06-01 ENCOUNTER — Ambulatory Visit: Payer: Medicare Other | Admitting: Certified Registered Nurse Anesthetist

## 2020-06-01 ENCOUNTER — Ambulatory Visit
Admission: RE | Admit: 2020-06-01 | Discharge: 2020-06-01 | Disposition: A | Discharge status: Home / Self Care | Payer: Medicare Other | Attending: Internal Medicine | Admitting: Internal Medicine

## 2020-06-01 DIAGNOSIS — Z888 Allergy status to other drugs, medicaments and biological substances status: Secondary | ICD-10-CM | POA: Insufficient documentation

## 2020-06-01 DIAGNOSIS — F419 Anxiety disorder, unspecified: Secondary | ICD-10-CM | POA: Diagnosis not present

## 2020-06-01 DIAGNOSIS — D122 Benign neoplasm of ascending colon: Secondary | ICD-10-CM | POA: Insufficient documentation

## 2020-06-01 DIAGNOSIS — Z7951 Long term (current) use of inhaled steroids: Secondary | ICD-10-CM | POA: Insufficient documentation

## 2020-06-01 DIAGNOSIS — Z886 Allergy status to analgesic agent status: Secondary | ICD-10-CM | POA: Diagnosis not present

## 2020-06-01 DIAGNOSIS — K5730 Diverticulosis of large intestine without perforation or abscess without bleeding: Secondary | ICD-10-CM | POA: Diagnosis not present

## 2020-06-01 DIAGNOSIS — Z881 Allergy status to other antibiotic agents status: Secondary | ICD-10-CM | POA: Insufficient documentation

## 2020-06-01 DIAGNOSIS — K222 Esophageal obstruction: Secondary | ICD-10-CM | POA: Insufficient documentation

## 2020-06-01 DIAGNOSIS — I10 Essential (primary) hypertension: Secondary | ICD-10-CM | POA: Insufficient documentation

## 2020-06-01 DIAGNOSIS — Z885 Allergy status to narcotic agent status: Secondary | ICD-10-CM | POA: Diagnosis not present

## 2020-06-01 DIAGNOSIS — Z1211 Encounter for screening for malignant neoplasm of colon: Secondary | ICD-10-CM | POA: Insufficient documentation

## 2020-06-01 DIAGNOSIS — K6289 Other specified diseases of anus and rectum: Secondary | ICD-10-CM | POA: Insufficient documentation

## 2020-06-01 DIAGNOSIS — Z880 Allergy status to penicillin: Secondary | ICD-10-CM | POA: Insufficient documentation

## 2020-06-01 DIAGNOSIS — R1310 Dysphagia, unspecified: Secondary | ICD-10-CM | POA: Diagnosis present

## 2020-06-01 DIAGNOSIS — J45909 Unspecified asthma, uncomplicated: Secondary | ICD-10-CM | POA: Insufficient documentation

## 2020-06-01 DIAGNOSIS — K2970 Gastritis, unspecified, without bleeding: Secondary | ICD-10-CM | POA: Insufficient documentation

## 2020-06-01 DIAGNOSIS — Z791 Long term (current) use of non-steroidal anti-inflammatories (NSAID): Secondary | ICD-10-CM | POA: Insufficient documentation

## 2020-06-01 DIAGNOSIS — K635 Polyp of colon: Secondary | ICD-10-CM | POA: Diagnosis not present

## 2020-06-01 DIAGNOSIS — K449 Diaphragmatic hernia without obstruction or gangrene: Secondary | ICD-10-CM | POA: Insufficient documentation

## 2020-06-01 DIAGNOSIS — Z79899 Other long term (current) drug therapy: Secondary | ICD-10-CM | POA: Insufficient documentation

## 2020-06-01 DIAGNOSIS — F319 Bipolar disorder, unspecified: Secondary | ICD-10-CM | POA: Diagnosis not present

## 2020-06-01 DIAGNOSIS — Z882 Allergy status to sulfonamides status: Secondary | ICD-10-CM | POA: Insufficient documentation

## 2020-06-01 DIAGNOSIS — Z86010 Personal history of colonic polyps: Secondary | ICD-10-CM | POA: Diagnosis present

## 2020-06-01 DIAGNOSIS — K259 Gastric ulcer, unspecified as acute or chronic, without hemorrhage or perforation: Secondary | ICD-10-CM | POA: Insufficient documentation

## 2020-06-01 DIAGNOSIS — K269 Duodenal ulcer, unspecified as acute or chronic, without hemorrhage or perforation: Secondary | ICD-10-CM | POA: Insufficient documentation

## 2020-06-01 DIAGNOSIS — K219 Gastro-esophageal reflux disease without esophagitis: Secondary | ICD-10-CM | POA: Diagnosis not present

## 2020-06-01 HISTORY — DX: Unspecified asthma, uncomplicated: J45.909

## 2020-06-01 HISTORY — DX: Bipolar disorder, unspecified: F31.9

## 2020-06-01 HISTORY — DX: Anxiety disorder, unspecified: F41.9

## 2020-06-01 HISTORY — PX: ESOPHAGOGASTRODUODENOSCOPY (EGD) WITH PROPOFOL: SHX5813

## 2020-06-01 HISTORY — DX: Diverticulosis of intestine, part unspecified, without perforation or abscess without bleeding: K57.90

## 2020-06-01 HISTORY — DX: Hyperlipidemia, unspecified: E78.5

## 2020-06-01 HISTORY — PX: COLONOSCOPY WITH PROPOFOL: SHX5780

## 2020-06-01 HISTORY — DX: Personal history of colon polyps, unspecified: Z86.0100

## 2020-06-01 HISTORY — DX: Personal history of colonic polyps: Z86.010

## 2020-06-01 SURGERY — COLONOSCOPY WITH PROPOFOL
Anesthesia: General

## 2020-06-01 MED ORDER — PROPOFOL 500 MG/50ML IV EMUL
INTRAVENOUS | Status: DC | PRN
  Administered 2020-06-01: 160 ug/kg/min via INTRAVENOUS

## 2020-06-01 MED ORDER — EPHEDRINE SULFATE 50 MG/ML IJ SOLN
INTRAMUSCULAR | Status: DC | PRN
  Administered 2020-06-01: 10 mg via INTRAVENOUS

## 2020-06-01 MED ORDER — LIDOCAINE HCL (CARDIAC) PF 100 MG/5ML IV SOSY
PREFILLED_SYRINGE | INTRAVENOUS | Status: DC | PRN
  Administered 2020-06-01: 100 mg via INTRAVENOUS

## 2020-06-01 MED ORDER — SODIUM CHLORIDE 0.9 % IV SOLN: INTRAVENOUS | Status: DC

## 2020-06-01 MED ORDER — GLYCOPYRROLATE 0.2 MG/ML IJ SOLN
INTRAMUSCULAR | Status: DC | PRN
  Administered 2020-06-01: .2 mg via INTRAVENOUS

## 2020-06-01 MED ORDER — PROPOFOL 10 MG/ML IV BOLUS
INTRAVENOUS | Status: DC | PRN
  Administered 2020-06-01: 70 mg via INTRAVENOUS

## 2020-06-01 NOTE — Transfer of Care (Signed)
Immediate Anesthesia Transfer of Care Note  Patient: Tara Cabrera  Procedure(s) Performed: COLONOSCOPY WITH PROPOFOL (N/A ) ESOPHAGOGASTRODUODENOSCOPY (EGD) WITH PROPOFOL (N/A )  Patient Location: PACU  Anesthesia Type:General  Level of Consciousness: drowsy  Airway & Oxygen Therapy: Patient Spontanous Breathing and Patient connected to nasal cannula oxygen  Post-op Assessment: Report given to RN and Post -op Vital signs reviewed and stable  Post vital signs: Reviewed and stable  Last Vitals:  Vitals Value Taken Time  BP    Temp    Pulse 91 06/01/20 1146  Resp 14 06/01/20 1146  SpO2 99 % 06/01/20 1146  Vitals shown include unvalidated device data.  Last Pain:  Vitals:   06/01/20 0929  TempSrc: Temporal  PainSc: 0-No pain         Complications: No complications documented.

## 2020-06-01 NOTE — Anesthesia Preprocedure Evaluation (Signed)
Anesthesia Evaluation  Patient identified by MRN, date of birth, ID band Patient awake    Reviewed: Allergy & Precautions, H&P , NPO status , Patient's Chart, lab work & pertinent test results, reviewed documented beta blocker date and time   History of Anesthesia Complications Negative for: history of anesthetic complications  Airway Mallampati: I  TM Distance: >3 FB Neck ROM: full    Dental  (+) Dental Advidsory Given, Teeth Intact, Missing Permanent bridge x2:   Pulmonary neg shortness of breath, asthma , neg sleep apnea, neg COPD, neg recent URI, former smoker,    Pulmonary exam normal breath sounds clear to auscultation       Cardiovascular Exercise Tolerance: Good hypertension, (-) angina(-) Past MI and (-) Cardiac Stents Normal cardiovascular exam(-) dysrhythmias (-) Valvular Problems/Murmurs Rhythm:regular Rate:Normal     Neuro/Psych PSYCHIATRIC DISORDERS Anxiety Bipolar Disorder negative neurological ROS     GI/Hepatic negative GI ROS, Neg liver ROS,   Endo/Other  negative endocrine ROS  Renal/GU negative Renal ROS  negative genitourinary   Musculoskeletal   Abdominal   Peds  Hematology negative hematology ROS (+)   Anesthesia Other Findings Past Medical History: No date: Anxiety No date: Asthma No date: Bipolar disorder (HCC) No date: Diverticulosis No date: History of colonic polyps No date: Hyperlipidemia No date: Hypertension   Reproductive/Obstetrics negative OB ROS                             Anesthesia Physical Anesthesia Plan  ASA: II  Anesthesia Plan: General   Post-op Pain Management:    Induction: Intravenous  PONV Risk Score and Plan: 3 and Propofol infusion and TIVA  Airway Management Planned: Natural Airway and Nasal Cannula  Additional Equipment:   Intra-op Plan:   Post-operative Plan:   Informed Consent: I have reviewed the patients History  and Physical, chart, labs and discussed the procedure including the risks, benefits and alternatives for the proposed anesthesia with the patient or authorized representative who has indicated his/her understanding and acceptance.     Dental Advisory Given  Plan Discussed with: Anesthesiologist, CRNA and Surgeon  Anesthesia Plan Comments:         Anesthesia Quick Evaluation

## 2020-06-01 NOTE — Op Note (Signed)
[facility]ology Patient Name: Tara Cabrera Procedure Date: 06/01/2020 11:19 AM MRN: [id number] Account #: [id number] Date of Birth: 01-08-1946 Admit Type: Outpatient Age: 74 Room: [facility] ENDO ROOM 4 Gender: Female Note Status: Finalized Procedure:             Colonoscopy Indications:           Surveillance: Personal history of adenomatous polyps                         on last colonoscopy > 5 years ago Providers:             Lorie Apley K. Alice Reichert MD, MD Referring MD:          Tracie Harrier, MD (Referring MD) Medicines:             Propofol per Anesthesia Complications:         No immediate complications. Procedure:             Pre-Anesthesia Assessment:                        - The risks and benefits of the procedure and the                         sedation options and risks were discussed with the                         patient. All questions were answered and informed                         consent was obtained.                        - Patient identification and proposed procedure were                         verified prior to the procedure by the nurse. The                         procedure was verified in the procedure room.                        - ASA Grade Assessment: III - A patient with severe                         systemic disease.                        - After reviewing the risks and benefits, the patient                         was deemed in satisfactory condition to undergo the                         procedure.                        After obtaining informed consent, the colonoscope was                         passed under direct vision.  Throughout the procedure,                         the patient's blood pressure, pulse, and oxygen                         saturations were monitored continuously. The                         Colonoscope was introduced through the anus and                         advanced to the the cecum, identified  by appendiceal                         orifice and ileocecal valve. The colonoscopy was                         performed without difficulty. The patient tolerated                         the procedure well. The quality of the bowel                         preparation was adequate. The ileocecal valve,                         appendiceal orifice, and rectum were photographed. Findings:      The perianal examination was normal.      The digital rectal exam findings include decreased sphincter tone.       Pertinent negatives include no palpable rectal lesions.      Multiple small and large-mouthed diverticula were found in the entire       colon. There was no evidence of diverticular bleeding.      Two sessile polyps were found in the sigmoid colon and ascending colon.       The polyps were 4 to 6 mm in size. These polyps were removed with a       jumbo cold forceps. Resection and retrieval were complete.      The exam was otherwise without abnormality on direct and retroflexion       views. Impression:            - Decreased sphincter tone found on digital rectal                         exam.                        - Moderate diverticulosis in the entire examined                         colon. There was no evidence of diverticular bleeding.                        - Two 4 to 6 mm polyps in the sigmoid colon and in the                         ascending colon, removed with a jumbo cold forceps.  Resected and retrieved.                        - The examination was otherwise normal on direct and                         retroflexion views. Recommendation:        - Patient has a contact number available for                         emergencies. The signs and symptoms of potential                         delayed complications were discussed with the patient.                         Return to normal activities tomorrow. Written                         discharge instructions  were provided to the patient.                        - Resume previous diet.                        - Continue present medications.                        - Repeat colonoscopy is recommended for surveillance.                         The colonoscopy date will be determined after                         pathology results from today's exam become available                         for review.                        - Monitor results to esophageal dilation                        - Use Protonix (pantoprazole) 40 mg PO daily.                        - Return to nurse practitioner in 6 weeks.                        - Follow up with Stephens November, GI Nurse                         Practioner, in office to discuss results and monitor                         progress. Procedure Code(s):     --- Professional ---                        [phone], Colonoscopy, flexible; with biopsy, single or  multiple Diagnosis Code(s):     --- Professional ---                        K57.30, Diverticulosis of large intestine without                         perforation or abscess without bleeding                        K63.5, Polyp of colon                        K62.89, Other specified diseases of anus and rectum                        Z86.010, Personal history of colonic polyps CPT copyright 2019 American Medical Association. All rights reserved. The codes documented in this report are preliminary and upon coder review may  be revised to meet current compliance requirements. Efrain Sella MD, MD 06/01/2020 11:46:52 AM This report has been signed electronically. Number of Addenda: 0 Note Initiated On: 06/01/2020 11:19 AM Scope Withdrawal Time: 0 hours 4 minutes 46 seconds  Total Procedure Duration: 0 hours 8 minutes 31 seconds  Estimated Blood Loss:  Estimated blood loss: none.      [facility]

## 2020-06-01 NOTE — Op Note (Signed)
[facility] Gastroenterology Patient Name: Tara Cabrera Procedure Date: 06/01/2020 11:20 AM MRN: [id number] Account #: [id number] Date of Birth: [birth year] Admit Type: Outpatient Age: 74 Room: [facility] ENDO ROOM 4 Gender: Female Note Status: Finalized Procedure:             Upper GI endoscopy Indications:           Dysphagia Providers:             Benay Pike. Alice Reichert MD, MD Referring MD:          Tracie Harrier, MD (Referring MD) Medicines:             Propofol per Anesthesia Complications:         No immediate complications. Procedure:             Pre-Anesthesia Assessment:                        - The risks and benefits of the procedure and the                         sedation options and risks were discussed with the                         patient. All questions were answered and informed                         consent was obtained.                        - Patient identification and proposed procedure were                         verified prior to the procedure by the nurse. The                         procedure was verified in the procedure room.                        - ASA Grade Assessment: III - A patient with severe                         systemic disease.                        - After reviewing the risks and benefits, the patient                         was deemed in satisfactory condition to undergo the                         procedure.                        After obtaining informed consent, the endoscope was                         passed under direct vision. Throughout the procedure,                         the patient's blood pressure, pulse, and oxygen  saturations were monitored continuously. The Endoscope                         was introduced through the mouth, and advanced to the                         third part of duodenum. The upper GI endoscopy was                         accomplished without difficulty. The  patient tolerated                         the procedure well. Findings:      One benign-appearing, intrinsic moderate stenosis was found at the       gastroesophageal junction. This stenosis measured 1.3 cm (inner       diameter) x less than one cm (in length). The stenosis was traversed.       The scope was withdrawn. Dilation was performed with a Maloney dilator       with mild resistance at 52 Fr.      A 2 cm hiatal hernia was present.      Patchy moderate inflammation characterized by congestion (edema),       erosions and erythema was found in the gastric antrum. Biopsies were       taken with a cold forceps for Helicobacter pylori testing.      Three non-bleeding superficial duodenal ulcers with no stigmata of       bleeding were found in the duodenal bulb. The largest lesion was 4 mm in       largest dimension.      The second portion of the duodenum and third portion of the duodenum       were normal.      The exam was otherwise without abnormality. Impression:            - Benign-appearing esophageal stenosis. Dilated.                        - 2 cm hiatal hernia.                        - Gastritis. Biopsied.                        - Non-bleeding duodenal ulcers with no stigmata of                         bleeding.                        - Normal second portion of the duodenum and third                         portion of the duodenum.                        - The examination was otherwise normal. Recommendation:        - Await pathology results.                        - Monitor results to esophageal dilation                        -  Use Protonix (pantoprazole) 40 mg PO daily.                        - Proceed with colonoscopy Procedure Code(s):     --- Professional ---                        [id number], Esophagogastroduodenoscopy, flexible,                         transoral; with biopsy, single or multiple                        43450, Dilation of esophagus, by unguided sound or                          bougie, single or multiple passes Diagnosis Code(s):     --- Professional ---                        R13.10, Dysphagia, unspecified                        K26.9, Duodenal ulcer, unspecified as acute or                         chronic, without hemorrhage or perforation                        K29.70, Gastritis, unspecified, without bleeding                        K44.9, Diaphragmatic hernia without obstruction or                         gangrene                        K22.2, Esophageal obstruction CPT copyright 2019 American Medical Association. All rights reserved. The codes documented in this report are preliminary and upon coder review may  be revised to meet current compliance requirements. Efrain Sella MD, MD 06/01/2020 11:31:43 AM This report has been signed electronically. Number of Addenda: 0 Note Initiated On: 06/01/2020 11:20 AM Estimated Blood Loss:  Estimated blood loss: none.      [facility]

## 2020-06-01 NOTE — H&P (Signed)
Outpatient short stay form Pre-procedure 06/01/2020 11:08 AM Jame Morrell K. Alice Reichert, M.D.  Primary Physician: Tracie Harrier, M.D.  Reason for visit:  Dysphagia,  History of adenomatous colon polyp  History of present illness:  Patient with occasional intermittent dysphagia to solids but denies history of significant GERD, dyspepsia or indigestion.                           Patient presents for colonoscopy for a personal hx of colon polyps. The patient denies abdominal pain, abnormal weight loss or rectal bleeding.      Current Facility-Administered Medications:  .  0.9 %  sodium chloride infusion, , Intravenous, Continuous, [place], Benay Pike, MD, Last Rate: 20 mL/hr at 06/01/20 0938, New Bag at 06/01/20 3151  Medications Prior to Admission  Medication Sig Dispense Refill Last Dose  . acetaminophen (TYLENOL) 500 MG tablet Take 500 mg by mouth every 6 (six) hours as needed.     Marland Kitchen albuterol (VENTOLIN HFA) 108 (90 Base) MCG/ACT inhaler Inhale 2 puffs into the lungs every 4 (four) hours as needed for wheezing or shortness of breath.     . ALPRAZolam (XANAX) 0.5 MG tablet Take 0.5 mg by mouth at bedtime as needed for anxiety.     . budesonide-formoterol (SYMBICORT) 160-4.5 MCG/ACT inhaler Inhale 2 puffs into the lungs 2 (two) times daily.   05/31/2020 at Unknown time  . fluticasone (FLONASE) 50 MCG/ACT nasal spray Place 2 sprays into both nostrils daily.     Marland Kitchen losartan-hydrochlorothiazide (HYZAAR) 100-12.5 MG tablet Take 1 tablet by mouth daily.   05/31/2020 at Unknown time  . meloxicam (MOBIC) 15 MG tablet Take 15 mg by mouth daily.     . metoprolol-hydrochlorothiazide (LOPRESSOR HCT) 50-25 MG tablet Take 1 tablet by mouth daily. TAKE 1 & 1/2 TABLETS EVERY AM AND 1 TABLET AT 4:00 PM   05/31/2020 at Unknown time  . montelukast (SINGULAIR) 10 MG tablet Take 10 mg by mouth at bedtime.     . phenazopyridine (PYRIDIUM) 95 MG tablet Take 1 tablet (95 mg total) by mouth 3 (three) times daily as needed for  pain. (Patient not taking: Reported on 06/01/2020) 6 tablet 0 Not Taking at Unknown time     Allergies  Allergen Reactions  . Lisinopril-Hydrochlorothiazide Anaphylaxis  . Codeine Nausea And Vomiting  . Contrast Media [Iodinated Diagnostic Agents] Other (See Comments)    VAGINAL MUSCLES SPASMS  . Flagyl [Metronidazole] Nausea Only  . Nsaids Other (See Comments)    KIDNEY DISORDER  . Penicillins   . Prednisone Other (See Comments)    TACHCARDIA,NAUSEA  . Sulfa Antibiotics Other (See Comments)    UNKNOWN  . Tricor [Fenofibrate] Other (See Comments)    MYOPATHY  . Lipitor [Atorvastatin] Rash  . Norvasc [Amlodipine] Rash     Past Medical History:  Diagnosis Date  . Anxiety   . Asthma   . Bipolar disorder ([place])   . Diverticulosis   . History of colonic polyps   . Hyperlipidemia   . Hypertension     Review of systems:  Otherwise negative.    Physical Exam  Gen: Alert, oriented. Appears stated age.  HEENT: [place]/AT. PERRLA. Lungs: CTA, no wheezes. CV: RR nl S1, S2. Abd: soft, benign, no masses. BS+ Ext: No edema. Pulses 2+    Planned procedures: Proceed with EGD and colonoscopy. The patient understands the nature of the planned procedure, indications, risks, alternatives and potential complications including but not limited to  bleeding, infection, perforation, damage to internal organs and possible oversedation/side effects from anesthesia. The patient agrees and gives consent to proceed.  Please refer to procedure notes for findings, recommendations and patient disposition/instructions.     Jakala Herford K. Alice Reichert, M.D. Gastroenterology 06/01/2020  11:08 AM

## 2020-06-01 NOTE — Anesthesia Postprocedure Evaluation (Signed)
Anesthesia Post Note  Patient: Amarys L Capitano  Procedure(s) Performed: COLONOSCOPY WITH PROPOFOL (N/A ) ESOPHAGOGASTRODUODENOSCOPY (EGD) WITH PROPOFOL (N/A )  Patient location during evaluation: Endoscopy Anesthesia Type: General Level of consciousness: awake and alert Pain management: pain level controlled Vital Signs Assessment: post-procedure vital signs reviewed and stable Respiratory status: spontaneous breathing, nonlabored ventilation, respiratory function stable and patient connected to nasal cannula oxygen Cardiovascular status: blood pressure returned to baseline and stable Postop Assessment: no apparent nausea or vomiting Anesthetic complications: no   No complications documented.   Last Vitals:  Vitals:   06/01/20 1156 06/01/20 1206  BP: (!) 96/60 (!) 98/64  Pulse: 88 83  Resp: 22 17  Temp:    SpO2: 96% 96%    Last Pain:  Vitals:   06/01/20 1206  TempSrc:   PainSc: 0-No pain                 Martha Clan

## 2020-06-05 LAB — SURGICAL PATHOLOGY

## 2020-06-20 ENCOUNTER — Other Ambulatory Visit: Payer: Self-pay | Admitting: Internal Medicine

## 2020-06-20 DIAGNOSIS — Z1231 Encounter for screening mammogram for malignant neoplasm of breast: Secondary | ICD-10-CM

## 2020-07-24 ENCOUNTER — Ambulatory Visit
Admission: RE | Admit: 2020-07-24 | Discharge: 2020-07-24 | Disposition: A | Discharge status: Home / Self Care | Payer: Medicare Other | Source: Ambulatory Visit | Attending: Internal Medicine | Admitting: Internal Medicine

## 2020-07-24 ENCOUNTER — Other Ambulatory Visit: Payer: Self-pay

## 2020-07-24 DIAGNOSIS — Z1231 Encounter for screening mammogram for malignant neoplasm of breast: Secondary | ICD-10-CM | POA: Insufficient documentation

## 2020-12-27 ENCOUNTER — Telehealth: Payer: Self-pay

## 2020-12-28 ENCOUNTER — Ambulatory Visit: Payer: Medicare Other | Admitting: Dermatology

## 2020-12-28 ENCOUNTER — Other Ambulatory Visit: Payer: Self-pay | Admitting: *Deleted

## 2020-12-28 DIAGNOSIS — Z122 Encounter for screening for malignant neoplasm of respiratory organs: Secondary | ICD-10-CM

## 2020-12-28 DIAGNOSIS — Z87891 Personal history of nicotine dependence: Secondary | ICD-10-CM

## 2020-12-28 NOTE — Progress Notes (Signed)
Contacted and scheduled for annual lung screening scan. Patient is a former smoker, quit 2010, 35.25 pack year history.

## 2021-01-04 ENCOUNTER — Other Ambulatory Visit: Payer: Self-pay

## 2021-01-04 ENCOUNTER — Ambulatory Visit
Admission: RE | Admit: 2021-01-04 | Discharge: 2021-01-04 | Disposition: A | Discharge status: Home / Self Care | Payer: Medicare Other | Source: Ambulatory Visit | Attending: Nurse Practitioner | Admitting: Nurse Practitioner

## 2021-01-04 DIAGNOSIS — Z122 Encounter for screening for malignant neoplasm of respiratory organs: Secondary | ICD-10-CM | POA: Insufficient documentation

## 2021-01-04 DIAGNOSIS — Z87891 Personal history of nicotine dependence: Secondary | ICD-10-CM | POA: Diagnosis present

## 2021-01-11 ENCOUNTER — Encounter: Payer: Self-pay | Admitting: *Deleted

## 2021-01-14 ENCOUNTER — Encounter: Payer: Self-pay | Admitting: Emergency Medicine

## 2021-01-14 ENCOUNTER — Emergency Department: Payer: Medicare Other

## 2021-01-14 ENCOUNTER — Other Ambulatory Visit: Payer: Self-pay

## 2021-01-14 ENCOUNTER — Emergency Department
Admission: EM | Admit: 2021-01-14 | Discharge: 2021-01-14 | Disposition: A | Discharge status: Home / Self Care | Payer: Medicare Other | Attending: Emergency Medicine | Admitting: Emergency Medicine

## 2021-01-14 DIAGNOSIS — J4541 Moderate persistent asthma with (acute) exacerbation: Secondary | ICD-10-CM | POA: Insufficient documentation

## 2021-01-14 DIAGNOSIS — Z7951 Long term (current) use of inhaled steroids: Secondary | ICD-10-CM | POA: Diagnosis not present

## 2021-01-14 DIAGNOSIS — J45909 Unspecified asthma, uncomplicated: Secondary | ICD-10-CM | POA: Diagnosis not present

## 2021-01-14 DIAGNOSIS — Z79899 Other long term (current) drug therapy: Secondary | ICD-10-CM | POA: Insufficient documentation

## 2021-01-14 DIAGNOSIS — R519 Headache, unspecified: Secondary | ICD-10-CM | POA: Diagnosis not present

## 2021-01-14 DIAGNOSIS — Z20822 Contact with and (suspected) exposure to covid-19: Secondary | ICD-10-CM | POA: Diagnosis not present

## 2021-01-14 DIAGNOSIS — H938X3 Other specified disorders of ear, bilateral: Secondary | ICD-10-CM | POA: Diagnosis not present

## 2021-01-14 DIAGNOSIS — I10 Essential (primary) hypertension: Secondary | ICD-10-CM | POA: Diagnosis not present

## 2021-01-14 DIAGNOSIS — Z87891 Personal history of nicotine dependence: Secondary | ICD-10-CM | POA: Insufficient documentation

## 2021-01-14 DIAGNOSIS — R0602 Shortness of breath: Secondary | ICD-10-CM | POA: Diagnosis present

## 2021-01-14 LAB — RESP PANEL BY RT-PCR (FLU A&B, COVID) ARPGX2
Influenza A by PCR: NEGATIVE
Influenza B by PCR: NEGATIVE
SARS Coronavirus 2 by RT PCR: NEGATIVE

## 2021-01-14 MED ORDER — ALBUTEROL SULFATE HFA 108 (90 BASE) MCG/ACT IN AERS: 1.0000 | INHALATION_SPRAY | RESPIRATORY_TRACT | 0 refills | Status: AC | PRN

## 2021-01-14 MED ORDER — ALBUTEROL SULFATE (2.5 MG/3ML) 0.083% IN NEBU
2.5000 mg | INHALATION_SOLUTION | Freq: Once | RESPIRATORY_TRACT | Status: AC
  Administered 2021-01-14: 2.5 mg via RESPIRATORY_TRACT
  Filled 2021-01-14: qty 3

## 2021-01-14 NOTE — Discharge Instructions (Addendum)
You were seen today for cough and shortness of breath.  Your chest x-ray did not show any signs of inflammation or infection.  Your Covid and flu test were negative.  Please continue your prednisone, Tessalon, Benadryl, Symbicort, Singulair and Flonase.  I am giving you a prescription for an albuterol inhaler to take every 4-6 hours as needed.  Please follow-up with your PCP if symptoms persist or worsen.

## 2021-01-14 NOTE — ED Triage Notes (Signed)
Pt to ED via POV, pt states that she has been having an Asthma flare for the last week. Pt reports that she was put on prednisone and Tessalon [place] but she does not feel like she has improved. Pt states that she has been using her inhaler at home with little relief. Pt is in NAD, pt is talking in complete sentences.

## 2021-01-14 NOTE — ED Provider Notes (Signed)
[facility] Emergency Department Provider Note ____________________________________________  Time seen: 1010  I have reviewed the triage vital signs and the nursing notes.  HISTORY  Chief Complaint  Asthma   HPI Cayden L Price is a 75 y.o. female presents to the ER today with complaint of headache, runny nose, ear fullness, cough and shortness of breath.  She reports this started 1 week ago.  The headache is located in her forehead.  She describes the pain as pressure.  She is blowing clear mucus out of her nose.  She denies ear drainage or loss of hearing.  The cough is dry and nonproductive.  The shortness of breath occurs with coughing spells but not with exertion.  She denies nasal congestion, ear pain, sore throat, loss of taste/smell or chest pain.  She denies nausea, vomiting or diarrhea.  She denies fever, chills or body aches.  She called into her PCPs office who put her on Prednisone, Benadryl and Tessalon [place].  She has been taking these as prescribed but does not feel like she is getting any improvement.  She also takes Symbicort, Singulair and Flonase for asthma and allergy symptoms.  She does not smoke.  She has not had sick contacts that she is aware of.  She has had 3 Covid vaccines.  Past Medical History:  Diagnosis Date  . Anxiety   . Asthma   . Bipolar disorder ([place])   . Diverticulosis   . History of colonic polyps   . Hyperlipidemia   . Hypertension     Patient Active Problem List   Diagnosis Date Noted  . Personal history of tobacco use, presenting hazards to health 12/22/2018    Past Surgical History:  Procedure Laterality Date  . ABDOMINAL HYSTERECTOMY    . BREAST CYST ASPIRATION Right 1984  . COLONOSCOPY WITH PROPOFOL N/A 06/01/2020   Procedure: COLONOSCOPY WITH PROPOFOL;  Surgeon: [place]edo, Benay Pike, MD;  Location: [facility] ENDOSCOPY;  Service: Gastroenterology;  Laterality: N/A;  . ESOPHAGOGASTRODUODENOSCOPY (EGD) WITH PROPOFOL N/A  06/01/2020   Procedure: ESOPHAGOGASTRODUODENOSCOPY (EGD) WITH PROPOFOL;  Surgeon: [place], Benay Pike, MD;  Location: [facility] ENDOSCOPY;  Service: Gastroenterology;  Laterality: N/A;  . FOOT SURGERY      Prior to Admission medications   Medication Sig Start Date End Date Taking? Authorizing Provider  acetaminophen (TYLENOL) 500 MG tablet Take 500 mg by mouth every 6 (six) hours as needed.    [provider]  albuterol (VENTOLIN HFA) 108 (90 Base) MCG/ACT inhaler Inhale 1-2 puffs into the lungs every 4 (four) hours as needed for wheezing or shortness of breath. 01/14/21   Jearld Fenton, NP  ALPRAZolam Duanne Moron) 0.5 MG tablet Take 0.5 mg by mouth at bedtime as needed for anxiety.    [provider]  budesonide-formoterol (SYMBICORT) 160-4.5 MCG/ACT inhaler Inhale 2 puffs into the lungs 2 (two) times daily.    [provider]  fluticasone (FLONASE) 50 MCG/ACT nasal spray Place 2 sprays into both nostrils daily.    [provider]  losartan-hydrochlorothiazide (HYZAAR) 100-12.5 MG tablet Take 1 tablet by mouth daily.    [provider]  meloxicam (MOBIC) 15 MG tablet Take 15 mg by mouth daily.    [provider]  metoprolol-hydrochlorothiazide (LOPRESSOR HCT) 50-25 MG tablet Take 1 tablet by mouth daily. TAKE 1 & 1/2 TABLETS EVERY AM AND 1 TABLET AT 4:00 PM    [provider]  montelukast (SINGULAIR) 10 MG tablet Take 10 mg by mouth at bedtime.  [provider]  phenazopyridine (PYRIDIUM) 95 MG tablet Take 1 tablet (95 mg total) by mouth 3 (three) times daily as needed for pain. Patient not taking: Reported on 06/01/2020 11/22/17   Cuthriell, Charline Bills, PA-C    Allergies Lisinopril-hydrochlorothiazide, Codeine, Contrast media [iodinated diagnostic agents], Flagyl [metronidazole], Nsaids, Penicillins, Prednisone, Sulfa antibiotics, Tricor [fenofibrate], Lipitor [atorvastatin], and Norvasc [amlodipine]  Family History  Problem  Relation Age of Onset  . Breast cancer Mother 2  . Breast cancer Maternal Aunt 42    Social History Social History   Tobacco Use  . Smoking status: Former Smoker    Packs/day: 0.75    Years: 47.00    Pack years: 35.25    Types: Cigarettes    Quit date: 2010    Years since quitting: 12.2  . Smokeless tobacco: Never Used  Vaping Use  . Vaping Use: Never used  Substance Use Topics  . Alcohol use: Yes    Comment: rarely  . Drug use: No    Review of Systems  Constitutional: Negative for fever, chills or body aches. Eyes: Negative for eye pain, eye redness, discharge or visual changes. ENT: Positive for runny nose, ear fullness.  Negative for nasal congestion, ear pain, loss of taste/smell or sore throat. Cardiovascular: Negative for chest pain or chest tightness. Respiratory: Positive for cough and shortness of breath. Gastrointestinal: Negative for abdominal pain, nausea, vomiting and diarrhea. Genitourinary: Negative for urinary urgency, frequency or dysuria. Musculoskeletal: Negative for joint pain or swelling. Skin: Negative for rash. Neurological: Positive for headache.  Negative for dizziness, focal weakness, tingling or numbness. ____________________________________________  PHYSICAL EXAM:  VITAL SIGNS: ED Triage Vitals  Enc Vitals Group     BP 01/14/21 1003 (!) 139/91     Pulse Rate 01/14/21 1003 90     Resp 01/14/21 1003 16     Temp 01/14/21 1003 98.6 F (37 C)     Temp Source 01/14/21 1003 Oral     SpO2 01/14/21 1003 99 %     Weight 01/14/21 1004 185 lb (83.9 kg)     Height 01/14/21 1004 5\' 1"  (1.549 m)     Head Circumference --      Peak Flow --      Pain Score 01/14/21 1004 0     Pain Loc --      Pain Edu? --      Excl. in [place]? --     Constitutional: Alert and oriented. Obese, in no distress. Head: Normocephalic and atraumatic. Eyes: Conjunctivae are normal. PERRL. Normal extraocular movements Ears: Canals clear. TMs intact bilaterally. Nose:  Nasal turbinates boggy and moist. No drainage, bleeding noted from the nares. Mouth/Throat: Mucous membranes are moist. No posterior pharynx erythema or exudate noted. Hematological/Lymphatic/Immunological: No cervical lymphadenopathy. Cardiovascular: Normal rate, regular rhythm. Respiratory: Normal respiratory effort. Diminished breath sounds throughout. No wheezes/rales/rhonchi. Gastrointestinal: Soft and nontender. No distention. Neurologic:  Normal gait without ataxia. Normal speech and language. No gross focal neurologic deficits are appreciated. Skin:  Skin is warm, dry and intact. No rash noted. ____________________________________________   LABS  ____________________________________________  EKG ED ECG REPORT   Date: 01/14/2021  EKG Time: 11:20 AM  Rate: 86  Rhythm:NSR, unchanged from prior  Narrative Interpretation: normal ECG     ____________________________________________   RADIOLOGY  Imaging Orders     DG Chest 2 View IMPRESSION:  No edema or airspace opacity. Heart size normal. Aortic  Atherosclerosis (ICD10-I70.0).    ____________________________________________    INITIAL IMPRESSION / ASSESSMENT  AND PLAN / ED COURSE  Acute Headache, Runny Nose, Ear Fullness, Cough, SOB. Hx of Asthma:  DDx include viral uri with cough, asthma exacerbation, covid, pneumonia Chest xray negative Covid//flu swab negative Albuterol nebulizer given  D/w pt, no signs of viral or bacterial infection No wheezing present on exam Continue Prednisone, Tessalon, Benadryl, Symbicort, Singulair and Flonase as prescribed. RX for Albuterol inhaler 1-2 puffs Q4-6 hrs as needed Follow up with PCP as an outpatient ____________________________________________  FINAL CLINICAL IMPRESSION(S) / ED DIAGNOSES  Final diagnoses:  Moderate persistent asthma with exacerbation      Jearld Fenton, NP 01/14/21 1120    Lucrezia Starch, MD 01/14/21 [phone]

## 2021-03-15 ENCOUNTER — Other Ambulatory Visit: Payer: Self-pay | Admitting: Internal Medicine

## 2021-04-21 ENCOUNTER — Encounter: Payer: Self-pay | Admitting: Emergency Medicine

## 2021-04-21 ENCOUNTER — Other Ambulatory Visit: Payer: Self-pay

## 2021-04-21 ENCOUNTER — Emergency Department
Admission: EM | Admit: 2021-04-21 | Discharge: 2021-04-21 | Disposition: A | Discharge status: Home / Self Care | Payer: Medicare Other | Attending: Emergency Medicine | Admitting: Emergency Medicine

## 2021-04-21 DIAGNOSIS — R350 Frequency of micturition: Secondary | ICD-10-CM | POA: Diagnosis present

## 2021-04-21 DIAGNOSIS — Z87891 Personal history of nicotine dependence: Secondary | ICD-10-CM | POA: Insufficient documentation

## 2021-04-21 DIAGNOSIS — Z7951 Long term (current) use of inhaled steroids: Secondary | ICD-10-CM | POA: Insufficient documentation

## 2021-04-21 DIAGNOSIS — Z8616 Personal history of COVID-19: Secondary | ICD-10-CM | POA: Insufficient documentation

## 2021-04-21 DIAGNOSIS — Z79899 Other long term (current) drug therapy: Secondary | ICD-10-CM | POA: Diagnosis not present

## 2021-04-21 DIAGNOSIS — N3001 Acute cystitis with hematuria: Secondary | ICD-10-CM | POA: Insufficient documentation

## 2021-04-21 DIAGNOSIS — J45909 Unspecified asthma, uncomplicated: Secondary | ICD-10-CM | POA: Insufficient documentation

## 2021-04-21 DIAGNOSIS — I10 Essential (primary) hypertension: Secondary | ICD-10-CM | POA: Insufficient documentation

## 2021-04-21 LAB — URINALYSIS, COMPLETE (UACMP) WITH MICROSCOPIC
Bacteria, UA: NONE SEEN
Bilirubin Urine: NEGATIVE
Glucose, UA: NEGATIVE mg/dL
Ketones, ur: NEGATIVE mg/dL
Nitrite: NEGATIVE
Protein, ur: 100 mg/dL — AB
RBC / HPF: >50 RBC/hpf — ABNORMAL HIGH (ref 0–5)
Specific Gravity, Urine: 1.011 (ref 1.005–1.030)
WBC, UA: >50 WBC/hpf — ABNORMAL HIGH (ref 0–5)
pH: 6 (ref 5.0–8.0)

## 2021-04-21 MED ORDER — DOXYCYCLINE MONOHYDRATE 100 MG PO CAPS: 100.0000 mg | ORAL_CAPSULE | Freq: Two times a day (BID) | ORAL | 0 refills | Status: AC

## 2021-04-21 MED ORDER — PHENAZOPYRIDINE HCL 200 MG PO TABS: 200.0000 mg | ORAL_TABLET | Freq: Three times a day (TID) | ORAL | 0 refills | Status: AC | PRN

## 2021-04-21 NOTE — Discharge Instructions (Addendum)
Read and follow discharge care instruction.  Take medication as directed.

## 2021-04-21 NOTE — ED Triage Notes (Signed)
Pt reports that she tested positive for COVID last week and they put her on a lot of medication, now she is having frequent urination, leaking, back pain and painful urination

## 2021-04-21 NOTE — ED Provider Notes (Signed)
[facility] Emergency Department Provider Note   ____________________________________________   Event Date/Time   First MD Initiated Contact with Patient 04/21/21 0725     (approximate)  I have reviewed the triage vital signs and the nursing notes.   HISTORY  Chief Complaint Urinary Frequency    HPI Tara Cabrera is a 75 y.o. female patient complain of urinary frequency, dysuria, incontinence, and back pain.  States back pain.  Patient she was tested positive for COVID-19 last week and was placed on a lot of medications.  Patient cannot recall the name of the medications.  Rates her pain as 8/10.  Described pain as "achy".         Past Medical History:  Diagnosis Date   Anxiety    Asthma    Bipolar disorder ([place])    Diverticulosis    History of colonic polyps    Hyperlipidemia    Hypertension     Patient Active Problem List   Diagnosis Date Noted   Personal history of tobacco use, presenting hazards to health 12/22/2018    Past Surgical History:  Procedure Laterality Date   ABDOMINAL HYSTERECTOMY     BREAST CYST ASPIRATION Right 1984   COLONOSCOPY WITH PROPOFOL N/A 06/01/2020   Procedure: COLONOSCOPY WITH PROPOFOL;  Surgeon: Toledo, Benay Pike, MD;  Location: [facility] ENDOSCOPY;  Service: Gastroenterology;  Laterality: N/A;   ESOPHAGOGASTRODUODENOSCOPY (EGD) WITH PROPOFOL N/A 06/01/2020   Procedure: ESOPHAGOGASTRODUODENOSCOPY (EGD) WITH PROPOFOL;  Surgeon: Toledo, Benay Pike, MD;  Location: [facility] ENDOSCOPY;  Service: Gastroenterology;  Laterality: N/A;   FOOT SURGERY      Prior to Admission medications   Medication Sig Start Date End Date Taking? Authorizing Provider  doxycycline (MONODOX) 100 MG capsule Take 1 capsule (100 mg total) by mouth 2 (two) times daily. 04/21/21  Yes Sable Feil, PA-C  phenazopyridine (PYRIDIUM) 200 MG tablet Take 1 tablet (200 mg total) by mouth 3 (three) times daily as needed for pain. 04/21/21  Yes Sable Feil, PA-C  acetaminophen (TYLENOL) 500 MG tablet Take 500 mg by mouth every 6 (six) hours as needed.    [provider]  albuterol (VENTOLIN HFA) 108 (90 Base) MCG/ACT inhaler Inhale 1-2 puffs into the lungs every 4 (four) hours as needed for wheezing or shortness of breath. 01/14/21   Jearld Fenton, NP  ALPRAZolam Duanne Moron) 0.5 MG tablet Take 0.5 mg by mouth at bedtime as needed for anxiety.    [provider]  budesonide-formoterol (SYMBICORT) 160-4.5 MCG/ACT inhaler Inhale 2 puffs into the lungs 2 (two) times daily.    [provider]  fluticasone (FLONASE) 50 MCG/ACT nasal spray Place 2 sprays into both nostrils daily.    [provider]  losartan-hydrochlorothiazide (HYZAAR) 100-12.5 MG tablet Take 1 tablet by mouth daily.    [provider]  meloxicam (MOBIC) 15 MG tablet Take 15 mg by mouth daily.    [provider]  metoprolol-hydrochlorothiazide (LOPRESSOR HCT) 50-25 MG tablet Take 1 tablet by mouth daily. TAKE 1 & 1/2 TABLETS EVERY AM AND 1 TABLET AT 4:00 PM    [provider]  montelukast (SINGULAIR) 10 MG tablet Take 10 mg by mouth at bedtime.    [provider]  phenazopyridine (PYRIDIUM) 95 MG tablet Take 1 tablet (95 mg total) by mouth 3 (three) times daily as needed for pain. Patient not taking: Reported on 06/01/2020 11/22/17   Cuthriell, Charline Bills, PA-C    Allergies Lisinopril-hydrochlorothiazide, Codeine, Contrast media [  iodinated diagnostic agents], Flagyl [metronidazole], Nsaids, Penicillins, Prednisone, Sulfa antibiotics, Tricor [fenofibrate], Lipitor [atorvastatin], and Norvasc [amlodipine]  Family History  Problem Relation Age of Onset   Breast cancer Mother 35   Breast cancer Maternal Aunt 81    Social History Social History   Tobacco Use   Smoking status: Former    Packs/day: 0.75    Years: 47.00    Pack years: 35.25    Types: Cigarettes    Quit date: 2010    Years since quitting:  12.4   Smokeless tobacco: Never  Vaping Use   Vaping Use: Never used  Substance Use Topics   Alcohol use: Yes    Comment: rarely   Drug use: No    Review of Systems  Constitutional: No fever/chills Eyes: No visual changes. ENT: No sore throat. Cardiovascular: Denies chest pain. Respiratory: Denies shortness of breath. Gastrointestinal: No abdominal pain.  No nausea, no vomiting.  No diarrhea.  No constipation. Genitourinary: Negative for dysuria. Musculoskeletal: Negative for back pain. Skin: Negative for rash. Neurological: Negative for headaches, focal weakness or numbness. Psychiatric: Anxiety and bipolar Endocrine: Hyperlipidemia hypertension Hematological/Lymphatic:  Allergic/Immunilogical: Codeine, Flagyl, IVP dye, lisinopril, NSAIDs, penicillin, prednisone, sulfa, Tricor, Lipitor, and Norvasc . ____________________________________________   PHYSICAL EXAM:  VITAL SIGNS: ED Triage Vitals  Enc Vitals Group     BP 04/21/21 0712 (!) 141/84     Pulse Rate 04/21/21 0709 88     Resp 04/21/21 0709 20     Temp 04/21/21 0709 99.6 F (37.6 C)     Temp Source 04/21/21 0709 Oral     SpO2 --      Weight 04/21/21 0711 184 lb 15.5 oz (83.9 kg)     Height 04/21/21 0711 5\' 1"  (1.549 m)     Head Circumference --      Peak Flow --      Pain Score 04/21/21 0711 8     Pain Loc --      Pain Edu? --      Excl. in [place]? --     Constitutional: Alert and oriented. Well appearing and in no acute distress. Eyes: Conjunctivae are normal. PERRL. EOMI. Head: Atraumatic. Nose: No congestion/rhinnorhea. Mouth/Throat: Mucous membranes are moist.  Oropharynx non-erythematous. Neck: No stridor.  No cervical spine tenderness to palpation. Hematological/Lymphatic/Immunilogical: No cervical lymphadenopathy. Cardiovascular: Normal rate, regular rhythm. Grossly normal heart sounds.  Good peripheral circulation. Respiratory: Normal respiratory effort.  No retractions. Lungs  CTAB. Gastrointestinal: Soft and nontender. No distention. No abdominal bruits. No CVA tenderness. Genitourinary: Deferred Musculoskeletal: No lower extremity tenderness nor edema.  No joint effusions. Neurologic:  Normal speech and language. No gross focal neurologic deficits are appreciated. No gait instability. Skin:  Skin is warm, dry and intact. No rash noted. Psychiatric: Mood and affect are normal. Speech and behavior are normal.  ____________________________________________   LABS (all labs ordered are listed, but only abnormal results are displayed)  Labs Reviewed  URINALYSIS, COMPLETE (UACMP) WITH MICROSCOPIC - Abnormal; Notable for the following components:      Result Value   Color, Urine AMBER (*)    APPearance CLOUDY (*)    Hgb urine dipstick LARGE (*)    Protein, ur 100 (*)    Leukocytes,Ua LARGE (*)    RBC / HPF >50 (*)    WBC, UA >50 (*)    All other components within normal limits  URINE CULTURE   ____________________________________________  EKG   ____________________________________________  RADIOLOGY Cecilio Asper, personally viewed  and evaluated these images (plain radiographs) as part of my medical decision making, as well as reviewing the written report by the radiologist.  ED MD interpretation:    Official radiology report(s): No results found.  ____________________________________________   PROCEDURES  Procedure(s) performed (including Critical Care):  Procedures   ____________________________________________   INITIAL IMPRESSION / ASSESSMENT AND PLAN / ED COURSE  As part of my medical decision making, I reviewed the following data within the [facility]         Patient presents with urinary frequency and dysuria.  Patient recently tested positive for COVID 19.  Discussed lab results with patient and rationale for getting a urine culture.  Patient given discharge care instruction prescription for doxycycline  and Pyridium.  Patient advised follow-up PCP in 3 days for results of urine culture.      ____________________________________________   FINAL CLINICAL IMPRESSION(S) / ED DIAGNOSES  Final diagnoses:  Acute cystitis with hematuria     ED Discharge Orders          Ordered    doxycycline (MONODOX) 100 MG capsule  2 times daily        04/21/21 0834    phenazopyridine (PYRIDIUM) 200 MG tablet  3 times daily PRN        04/21/21 0834             Note:  This document was prepared using Dragon voice recognition software and may include unintentional dictation errors.    Sable Feil, PA-C 04/21/21 0258    Arta Silence, MD 04/21/21 [phone]

## 2021-04-23 LAB — URINE CULTURE: Culture: >=100000 — AB

## 2021-05-30 ENCOUNTER — Other Ambulatory Visit: Payer: Self-pay | Admitting: Internal Medicine

## 2021-09-04 ENCOUNTER — Other Ambulatory Visit: Payer: Self-pay | Admitting: Internal Medicine

## 2021-09-04 DIAGNOSIS — Z1231 Encounter for screening mammogram for malignant neoplasm of breast: Secondary | ICD-10-CM

## 2021-09-21 DIAGNOSIS — Z789 Other specified health status: Secondary | ICD-10-CM | POA: Insufficient documentation

## 2021-09-21 DIAGNOSIS — R7309 Other abnormal glucose: Secondary | ICD-10-CM | POA: Insufficient documentation

## 2021-11-09 ENCOUNTER — Other Ambulatory Visit: Payer: Self-pay

## 2021-11-09 ENCOUNTER — Ambulatory Visit
Admission: RE | Admit: 2021-11-09 | Discharge: 2021-11-09 | Disposition: A | Discharge status: Home / Self Care | Payer: Medicare Other | Source: Ambulatory Visit | Attending: Internal Medicine | Admitting: Internal Medicine

## 2021-11-09 DIAGNOSIS — Z1231 Encounter for screening mammogram for malignant neoplasm of breast: Secondary | ICD-10-CM | POA: Insufficient documentation

## 2021-12-28 NOTE — Telephone Encounter (Signed)
Signing encounter see previous note on 12/27/20

## 2022-01-24 ENCOUNTER — Other Ambulatory Visit: Payer: Self-pay

## 2022-01-24 ENCOUNTER — Emergency Department: Payer: Medicare Other

## 2022-01-24 ENCOUNTER — Emergency Department
Admission: EM | Admit: 2022-01-24 | Discharge: 2022-01-24 | Disposition: A | Discharge status: Home / Self Care | Payer: Medicare Other | Attending: Emergency Medicine | Admitting: Emergency Medicine

## 2022-01-24 DIAGNOSIS — R079 Chest pain, unspecified: Secondary | ICD-10-CM

## 2022-01-24 DIAGNOSIS — I10 Essential (primary) hypertension: Secondary | ICD-10-CM | POA: Insufficient documentation

## 2022-01-24 DIAGNOSIS — M79602 Pain in left arm: Secondary | ICD-10-CM | POA: Diagnosis present

## 2022-01-24 LAB — CBC
HCT: 41.6 % (ref 36.0–46.0)
Hemoglobin: 14.1 g/dL (ref 12.0–15.0)
MCH: 31.3 pg (ref 26.0–34.0)
MCHC: 33.9 g/dL (ref 30.0–36.0)
MCV: 92.2 fL (ref 80.0–100.0)
Platelets: 346 10*3/uL (ref 150–400)
RBC: 4.51 MIL/uL (ref 3.87–5.11)
RDW: 12.4 % (ref 11.5–15.5)
WBC: 8.1 10*3/uL (ref 4.0–10.5)
nRBC: 0 % (ref 0.0–0.2)

## 2022-01-24 LAB — BASIC METABOLIC PANEL
Anion gap: 10 (ref 5–15)
BUN: 20 mg/dL (ref 8–23)
CO2: 26 mmol/L (ref 22–32)
Calcium: 9.6 mg/dL (ref 8.9–10.3)
Chloride: 106 mmol/L (ref 98–111)
Creatinine, Ser: 0.96 mg/dL (ref 0.44–1.00)
GFR, Estimated: >60 mL/min (ref >60)
Glucose, Bld: 107 mg/dL — ABNORMAL HIGH (ref 70–99)
Potassium: 3.2 mmol/L — ABNORMAL LOW (ref 3.5–5.1)
Sodium: 142 mmol/L (ref 135–145)

## 2022-01-24 LAB — TROPONIN I (HIGH SENSITIVITY)
Troponin I (High Sensitivity): 11 ng/L (ref <18)
Troponin I (High Sensitivity): 10 ng/L (ref <18)

## 2022-01-24 NOTE — ED Triage Notes (Addendum)
Patient to ER via [facility] from home. Reports waking up at 3am with a sharp pain in her left upper chest/ shoulder/ neck that radiated down into her elbow. Denies injury. Denies dizziness/ nausea. Reports hx of HTN.  ? ?EMS VSS- bp 136/87, HR 90, spo2 98%. 12 lead EKG unremarkable.  ?

## 2022-01-24 NOTE — ED Provider Notes (Signed)
? ?[facility] ?Provider Note ? ? ? Event Date/Time  ? First MD Initiated Contact with Patient 01/24/22 [id number]   ?  (approximate) ? ?History  ? ?Chief Complaint: Chest Pain ? ?HPI ? ?Tara Cabrera is a 76 y.o. female with a past medical history of anxiety, bipolar, hypertension/hyperlipidemia, presents to the emergency department for left arm pain.  According to the patient she was awoken from her sleep around 3:00 this morning with a pain going from her left shoulder down into her left arm.  Patient states the pain has since gone away but she was worried that it could be a sign of a heart attack.  Patient denies any nausea shortness of breath or diaphoresis.  Denies any pain in the chest. ? ?Physical Exam  ? ?Triage Vital Signs: ?ED Triage Vitals  ?Enc Vitals Group  ?   BP 01/24/22 0911 134/83  ?   Pulse Rate 01/24/22 0909 93  ?   Resp 01/24/22 0909 19  ?   Temp 01/24/22 0909 98.1 ?F (36.7 ?C)  ?   Temp Source 01/24/22 0909 Oral  ?   SpO2 01/24/22 0909 98 %  ?   Weight 01/24/22 0906 179 lb (81.2 kg)  ?   Height 01/24/22 0906 '5\' 1"'$  (1.549 m)  ?   Head Circumference --   ?   Peak Flow --   ?   Pain Score 01/24/22 0906 5  ?   Pain Loc --   ?   Pain Edu? --   ?   Excl. in [place]? --   ? ? ?Most recent vital signs: ?Vitals:  ? 01/24/22 0909 01/24/22 0911  ?BP:  134/83  ?Pulse: 93   ?Resp: 19   ?Temp: 98.1 ?F (36.7 ?C)   ?SpO2: 98%   ? ? ?General: Awake, no distress.  ?CV:  Good peripheral perfusion.  Regular rate and rhythm  ?Resp:  Normal effort.  Equal breath sounds bilaterally.  ?Abd:  No distention.  Soft, nontender.  No rebound or guarding. ? ?ED Results / Procedures / Treatments  ? ?EKG ? ?EKG viewed and interpreted by myself shows a normal sinus rhythm at 77 bpm with a narrow QRS, normal axis, normal intervals, no concerning ST changes. ? ?RADIOLOGY ? ?I have personally reviewed the chest x-ray images, no acute abnormality seen on my evaluation. ?Radiology is read the chest x-ray is  negative ? ? ?MEDICATIONS ORDERED IN ED: ?Medications - No data to display ? ? ?IMPRESSION / MDM / ASSESSMENT AND PLAN / ED COURSE  ?I reviewed the triage vital signs and the nursing notes. ? ?Patient presents to the emergency department for left arm pain radiating from her left neck down to the left arm that awoke her from sleep around 3:00 this morning.  Patient denies any pain currently.  She is not sure if she slept on the arm.  Denies any chest pain.  No shortness of breath.  No nausea or diaphoresis.  Overall the patient appears well, no distress.  We will check labs including cardiac enzymes.  Patient's EKG is reassuring.  We will obtain a chest x-ray and continue to closely monitor.  Patient agreeable to plan. ? ?Patient's work-up is reassuring.  EKG is reassuring.  Chest x-ray shows no concerning findings.  Patient's labs including CBC is normal, chemistry is normal and troponin is negative x2.  Given the patient's age and symptoms I did consider admission to the hospital however given the patient's reassuring  physical exam reassuring work-up including lab work, vitals and as the patient is chest pain-free I believe she is safe for discharge home with PCP follow-up.  Patient agreeable to plan of care. ? ?FINAL CLINICAL IMPRESSION(S) / ED DIAGNOSES  ? ?Left arm pain ? ?Rx / DC Orders  ? ?PCP follow-up ? ?Note:  This document was prepared using Dragon voice recognition software and may include unintentional dictation errors. ?  ?Harvest Dark, MD ?01/24/22 1236 ? ?

## 2022-01-24 NOTE — ED Notes (Signed)
Patient transported to X-ray 

## 2022-02-01 ENCOUNTER — Other Ambulatory Visit: Payer: Self-pay | Admitting: *Deleted

## 2022-02-01 DIAGNOSIS — Z87891 Personal history of nicotine dependence: Secondary | ICD-10-CM

## 2022-02-19 ENCOUNTER — Ambulatory Visit
Admission: RE | Admit: 2022-02-19 | Discharge: 2022-02-19 | Disposition: A | Discharge status: Home / Self Care | Payer: Medicare Other | Source: Ambulatory Visit | Attending: Acute Care | Admitting: Acute Care

## 2022-02-19 DIAGNOSIS — I2510 Atherosclerotic heart disease of native coronary artery without angina pectoris: Secondary | ICD-10-CM | POA: Diagnosis not present

## 2022-02-19 DIAGNOSIS — J439 Emphysema, unspecified: Secondary | ICD-10-CM | POA: Diagnosis not present

## 2022-02-19 DIAGNOSIS — I700 Atherosclerosis of aorta: Secondary | ICD-10-CM | POA: Diagnosis not present

## 2022-02-19 DIAGNOSIS — Z122 Encounter for screening for malignant neoplasm of respiratory organs: Secondary | ICD-10-CM | POA: Diagnosis present

## 2022-02-19 DIAGNOSIS — Z87891 Personal history of nicotine dependence: Secondary | ICD-10-CM | POA: Insufficient documentation

## 2022-02-20 ENCOUNTER — Other Ambulatory Visit: Payer: Self-pay

## 2022-02-20 DIAGNOSIS — Z122 Encounter for screening for malignant neoplasm of respiratory organs: Secondary | ICD-10-CM

## 2022-02-20 DIAGNOSIS — Z87891 Personal history of nicotine dependence: Secondary | ICD-10-CM

## 2022-10-07 ENCOUNTER — Other Ambulatory Visit: Payer: Self-pay | Admitting: Internal Medicine

## 2022-10-07 DIAGNOSIS — Z1231 Encounter for screening mammogram for malignant neoplasm of breast: Secondary | ICD-10-CM

## 2022-11-11 ENCOUNTER — Ambulatory Visit
Admission: RE | Admit: 2022-11-11 | Discharge: 2022-11-11 | Disposition: A | Discharge status: Home / Self Care | Payer: Medicare Other | Source: Ambulatory Visit | Attending: Internal Medicine | Admitting: Internal Medicine

## 2022-11-11 DIAGNOSIS — Z1231 Encounter for screening mammogram for malignant neoplasm of breast: Secondary | ICD-10-CM

## 2023-01-19 ENCOUNTER — Other Ambulatory Visit: Payer: Self-pay | Admitting: Acute Care

## 2023-01-19 DIAGNOSIS — Z122 Encounter for screening for malignant neoplasm of respiratory organs: Secondary | ICD-10-CM

## 2023-01-19 DIAGNOSIS — Z87891 Personal history of nicotine dependence: Secondary | ICD-10-CM

## 2023-01-27 ENCOUNTER — Encounter: Payer: Self-pay | Admitting: Internal Medicine

## 2023-01-30 ENCOUNTER — Other Ambulatory Visit: Payer: Self-pay | Admitting: Internal Medicine

## 2023-01-30 DIAGNOSIS — Z1231 Encounter for screening mammogram for malignant neoplasm of breast: Secondary | ICD-10-CM

## 2023-02-07 ENCOUNTER — Other Ambulatory Visit: Payer: Self-pay | Admitting: Internal Medicine

## 2023-02-07 DIAGNOSIS — G8929 Other chronic pain: Secondary | ICD-10-CM

## 2023-02-12 ENCOUNTER — Ambulatory Visit
Admission: RE | Admit: 2023-02-12 | Discharge: 2023-02-12 | Disposition: A | Discharge status: Home / Self Care | Payer: Medicare Other | Source: Ambulatory Visit | Attending: Internal Medicine | Admitting: Internal Medicine

## 2023-02-12 DIAGNOSIS — G8929 Other chronic pain: Secondary | ICD-10-CM

## 2023-02-12 DIAGNOSIS — M25512 Pain in left shoulder: Secondary | ICD-10-CM | POA: Insufficient documentation

## 2023-02-21 ENCOUNTER — Ambulatory Visit
Admission: RE | Admit: 2023-02-21 | Discharge: 2023-02-21 | Disposition: A | Discharge status: Home / Self Care | Payer: Medicare Other | Source: Ambulatory Visit | Attending: Internal Medicine | Admitting: Internal Medicine

## 2023-02-21 DIAGNOSIS — Z122 Encounter for screening for malignant neoplasm of respiratory organs: Secondary | ICD-10-CM | POA: Diagnosis present

## 2023-02-21 DIAGNOSIS — Z87891 Personal history of nicotine dependence: Secondary | ICD-10-CM | POA: Diagnosis present

## 2023-05-28 ENCOUNTER — Encounter: Payer: Self-pay | Admitting: Dermatology

## 2023-05-28 ENCOUNTER — Ambulatory Visit: Payer: Medicare Other | Admitting: Dermatology

## 2023-05-28 VITALS — BP 145/77 | HR 69

## 2023-05-28 DIAGNOSIS — L821 Other seborrheic keratosis: Secondary | ICD-10-CM

## 2023-05-28 DIAGNOSIS — J45909 Unspecified asthma, uncomplicated: Secondary | ICD-10-CM | POA: Insufficient documentation

## 2023-05-28 DIAGNOSIS — F419 Anxiety disorder, unspecified: Secondary | ICD-10-CM | POA: Insufficient documentation

## 2023-05-28 DIAGNOSIS — L820 Inflamed seborrheic keratosis: Secondary | ICD-10-CM

## 2023-05-28 DIAGNOSIS — I10 Essential (primary) hypertension: Secondary | ICD-10-CM | POA: Insufficient documentation

## 2023-05-28 DIAGNOSIS — E782 Mixed hyperlipidemia: Secondary | ICD-10-CM | POA: Insufficient documentation

## 2023-05-28 NOTE — Patient Instructions (Signed)

## 2023-05-28 NOTE — Progress Notes (Signed)
   New Patient Visit   Subjective  Tara Cabrera is a 77 y.o. female who presents for the following: Mole. Left breast. Dur: ~1 month. Irritated at times. Rubbed by bra/clothing.   The patient has spots, moles and lesions to be evaluated, some may be new or changing and the patient may have concern these could be cancer.    The following portions of the chart were reviewed this encounter and updated as appropriate: medications, allergies, medical history  Review of Systems:  No other skin or systemic complaints except as noted in HPI or Assessment and Plan.  Objective  Well appearing patient in no apparent distress; mood and affect are within normal limits.  A focused examination was performed of the following areas: Left breast  Relevant physical exam findings are noted in the Assessment and Plan.  Left Lateral Breast x1 Erythematous keratotic or waxy stuck-on papule or plaque.    Assessment & Plan   Inflamed seborrheic keratosis Left Lateral Breast x1  Symptomatic, irritating, patient would like treated.  Destruction of lesion - Left Lateral Breast x1  Destruction method: cryotherapy   Informed consent: discussed and consent obtained   Lesion destroyed using liquid nitrogen: Yes   Region frozen until ice ball extended beyond lesion: Yes   Outcome: patient tolerated procedure well with no complications   Post-procedure details: wound care instructions given   Additional details:  Prior to procedure, discussed risks of blister formation, small wound, skin dyspigmentation, or rare scar following cryotherapy. Recommend Vaseline ointment to treated areas while healing.    SEBORRHEIC KERATOSIS - Stuck-on, waxy, tan-brown papules and/or plaques  - Benign-appearing - Discussed benign etiology and prognosis. - Observe - Call for any changes    Return if symptoms worsen or fail to improve.  I, Lawson Radar, CMA, am acting as scribe for Elie Goody,  MD.   Documentation: I have reviewed the above documentation for accuracy and completeness, and I agree with the above.  Elie Goody, MD

## 2023-11-13 ENCOUNTER — Ambulatory Visit
Admission: RE | Admit: 2023-11-13 | Discharge: 2023-11-13 | Disposition: A | Discharge status: Home / Self Care | Payer: Medicare Other | Source: Ambulatory Visit | Attending: Internal Medicine | Admitting: Internal Medicine

## 2023-11-13 DIAGNOSIS — Z1231 Encounter for screening mammogram for malignant neoplasm of breast: Secondary | ICD-10-CM | POA: Insufficient documentation

## 2024-02-11 IMAGING — CT CT CHEST LUNG CANCER SCREENING LOW DOSE W/O CM
2 of 5 series · 15 of 40 positions shown, 18 images · non-contrast
Comparison: Lung cancer screening CT dated January 04, 2021

CLINICAL DATA: Former smoker with 35 pack-year history



[Series 3: lung 1.00 · axial · 0.59mm/px · z∈[-1307,-1037]mm · 12 of 298 slices shown, 15 images]
[im 14/298  mediastinal]
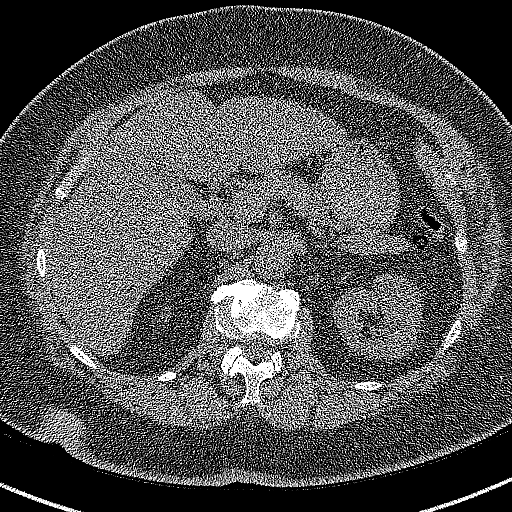
[im 14/298  lung]
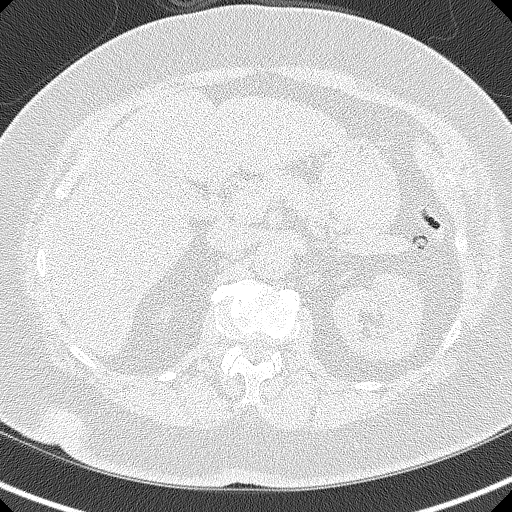
[im 41/298  lung]
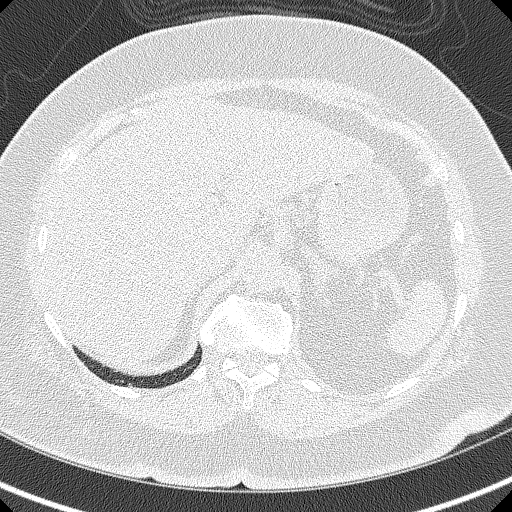
[im 68/298  lung]
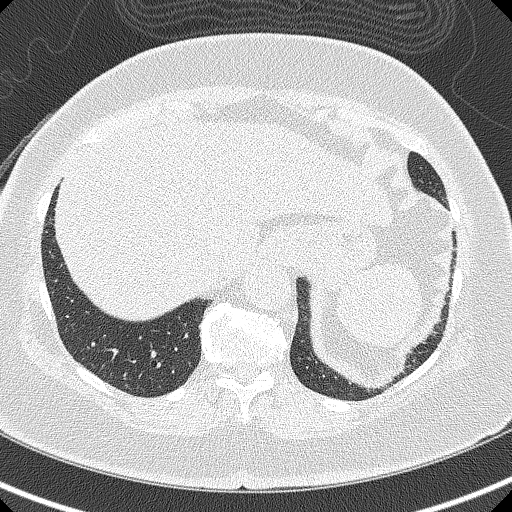
[im 95/298  lung]
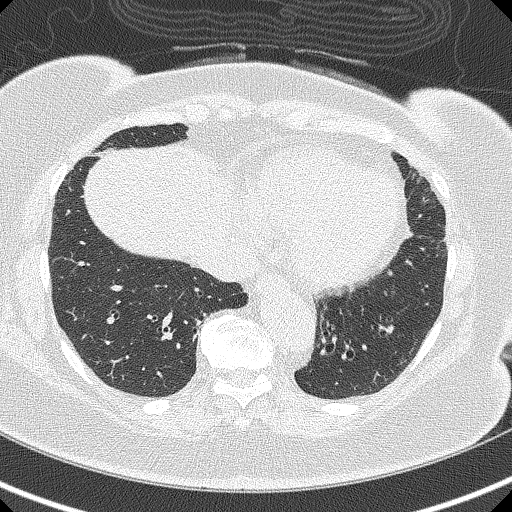
[im 109/298  mediastinal]
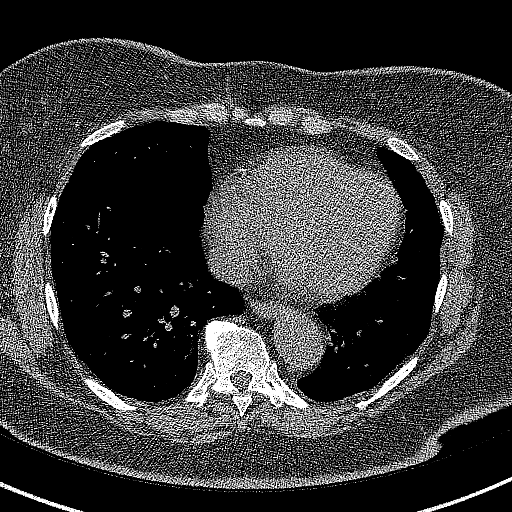
[im 109/298  lung]
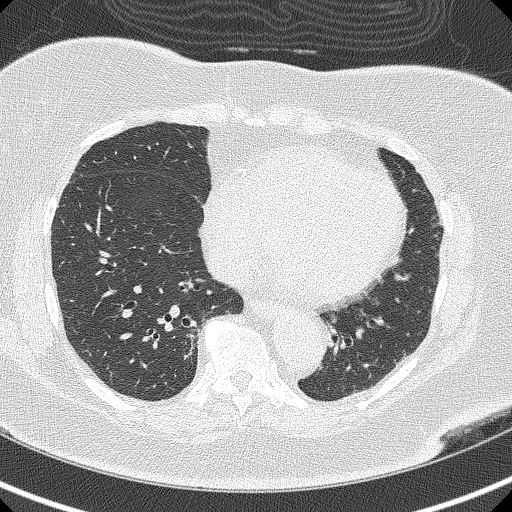
[im 136/298  lung]
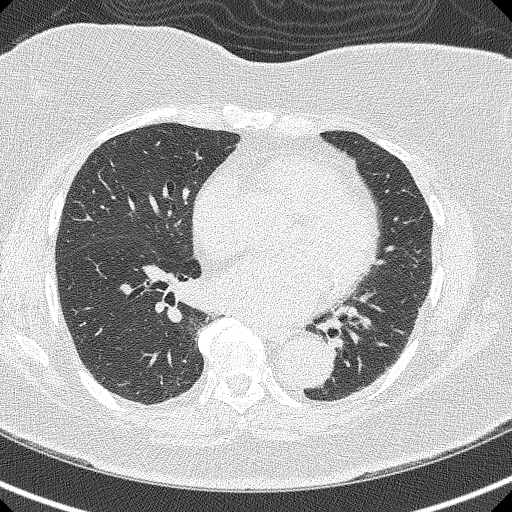
[im 163/298  lung]
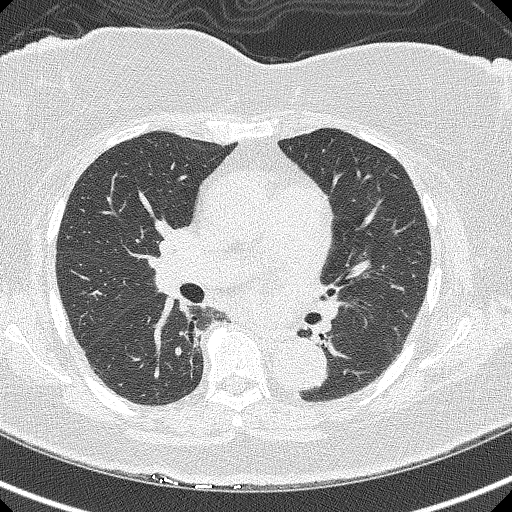
[im 190/298  lung]
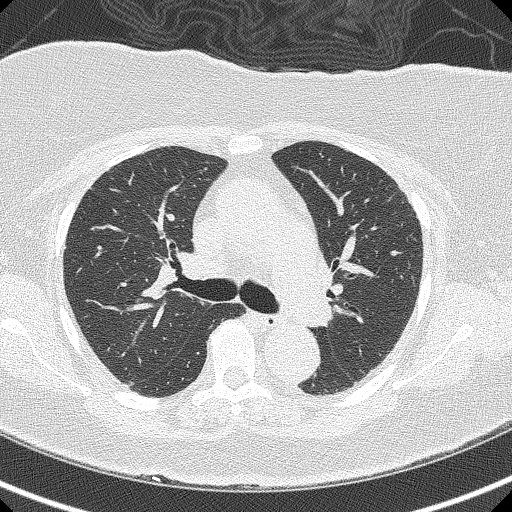
[im 203/298  mediastinal]
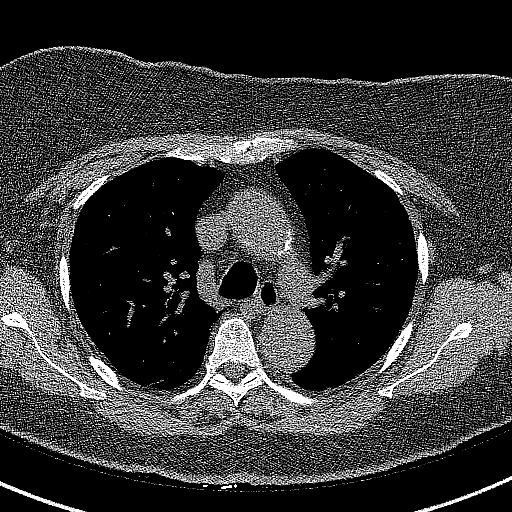
[im 203/298  lung]
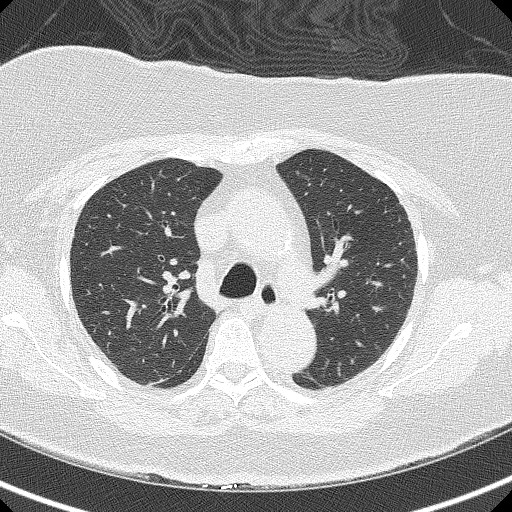
[im 230/298  lung]
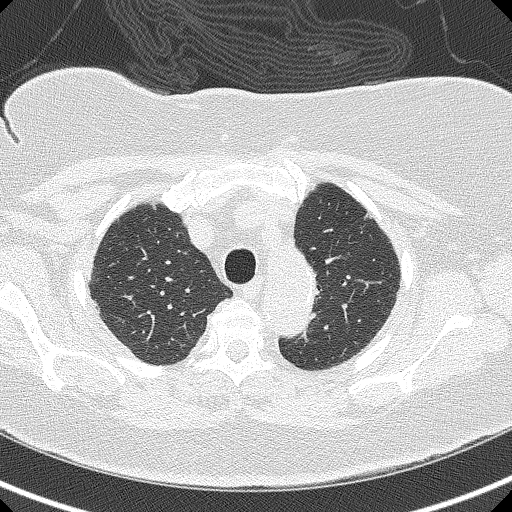
[im 257/298  lung]
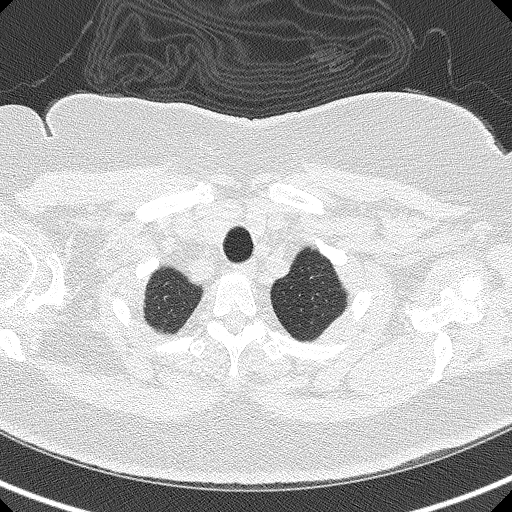
[im 284/298  lung]
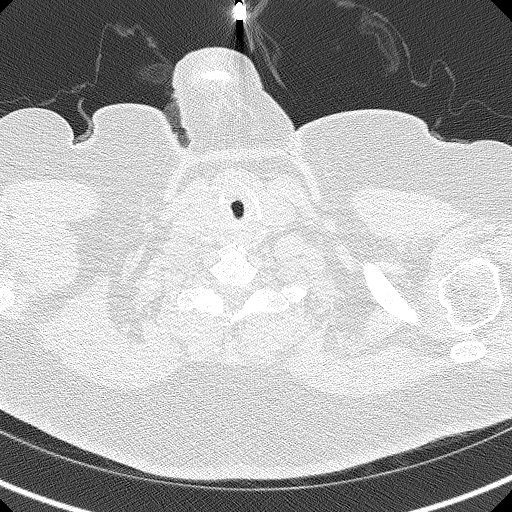

[Series 5: coronals lung 1.00 cor · coronal · 0.58mm/px · 3 of 289 slices shown]
[im 58/289  lung]
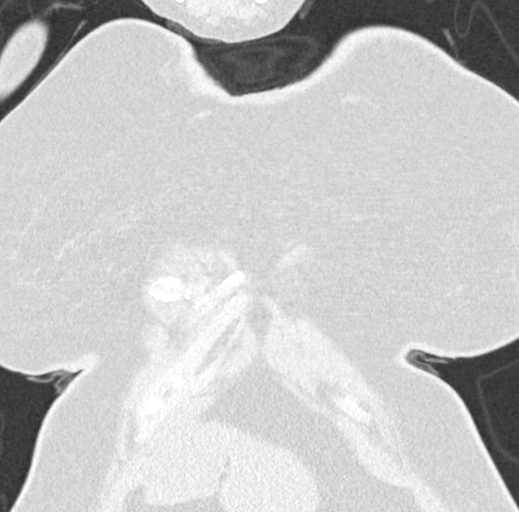
[im 116/289  lung]
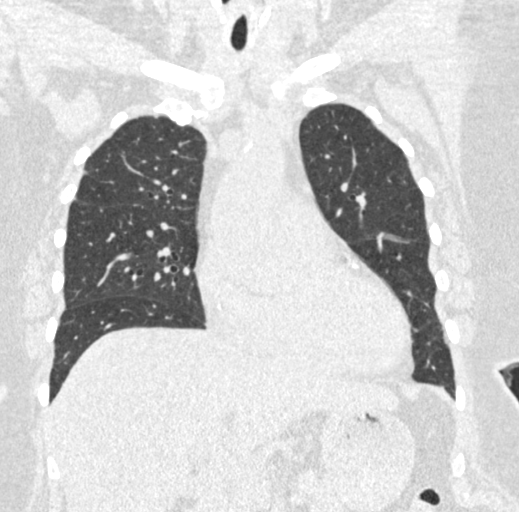
[im 173/289  lung]
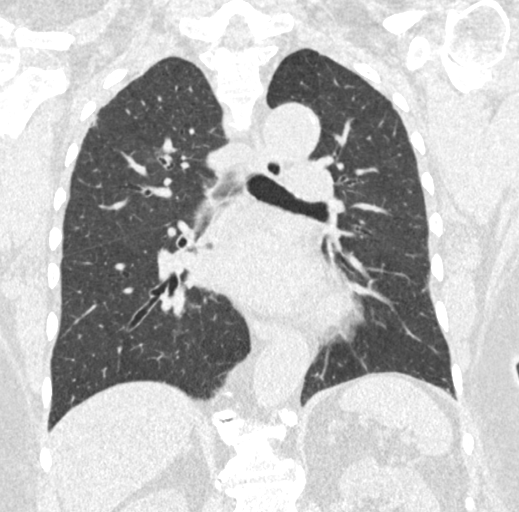

[15 of 40 positions shown; findings below may reference images not displayed]

FINDINGS: Cardiovascular: Normal heart size. No pericardial effusion.
Three-vessel coronary artery calcifications. Atherosclerotic disease
of the thoracic aorta.

Mediastinum/Nodes: Esophagus and thyroid are unremarkable. No
pathologically enlarged lymph nodes seen in the chest.

Lungs/Pleura: Central airways are patent. Mild centrilobular
emphysema. No consolidation, pleural effusion or pneumothorax.
Stable small solid pulmonary nodules. Reference nodule of the right
middle lobe measuring 4.3 mm in mean diameter on image 126.

Upper Abdomen: No acute abnormality.

Musculoskeletal: No chest wall mass or suspicious bone lesions
identified.
IMPRESSION: 1. Lung-RADS 2, benign appearance or behavior. Continue annual
screening with low-dose chest CT without contrast in 12 months.
2. Coronary artery calcifications, aortic Atherosclerosis
(LKYC4-YNT.T) and Emphysema (LKYC4-5QN.7).

## 2024-02-24 ENCOUNTER — Emergency Department

## 2024-02-24 ENCOUNTER — Other Ambulatory Visit: Payer: Self-pay

## 2024-02-24 ENCOUNTER — Encounter: Payer: Self-pay | Admitting: Emergency Medicine

## 2024-02-24 ENCOUNTER — Emergency Department
Admission: EM | Admit: 2024-02-24 | Discharge: 2024-02-24 | Disposition: A | Discharge status: Home / Self Care | Attending: Emergency Medicine | Admitting: Emergency Medicine

## 2024-02-24 DIAGNOSIS — J449 Chronic obstructive pulmonary disease, unspecified: Secondary | ICD-10-CM | POA: Diagnosis not present

## 2024-02-24 DIAGNOSIS — R079 Chest pain, unspecified: Secondary | ICD-10-CM | POA: Insufficient documentation

## 2024-02-24 DIAGNOSIS — M25512 Pain in left shoulder: Secondary | ICD-10-CM | POA: Insufficient documentation

## 2024-02-24 DIAGNOSIS — M542 Cervicalgia: Secondary | ICD-10-CM | POA: Diagnosis present

## 2024-02-24 DIAGNOSIS — M5412 Radiculopathy, cervical region: Secondary | ICD-10-CM | POA: Insufficient documentation

## 2024-02-24 DIAGNOSIS — I10 Essential (primary) hypertension: Secondary | ICD-10-CM | POA: Insufficient documentation

## 2024-02-24 LAB — BASIC METABOLIC PANEL WITH GFR
Anion gap: 10 (ref 5–15)
BUN: 14 mg/dL (ref 8–23)
CO2: 24 mmol/L (ref 22–32)
Calcium: 9.3 mg/dL (ref 8.9–10.3)
Chloride: 108 mmol/L (ref 98–111)
Creatinine, Ser: 0.82 mg/dL (ref 0.44–1.00)
GFR, Estimated: >60 mL/min (ref >60)
Glucose, Bld: 121 mg/dL — ABNORMAL HIGH (ref 70–99)
Potassium: 3.9 mmol/L (ref 3.5–5.1)
Sodium: 142 mmol/L (ref 135–145)

## 2024-02-24 LAB — CBC
HCT: 41.8 % (ref 36.0–46.0)
Hemoglobin: 14.2 g/dL (ref 12.0–15.0)
MCH: 32.1 pg (ref 26.0–34.0)
MCHC: 34 g/dL (ref 30.0–36.0)
MCV: 94.6 fL (ref 80.0–100.0)
Platelets: 309 10*3/uL (ref 150–400)
RBC: 4.42 MIL/uL (ref 3.87–5.11)
RDW: 12.9 % (ref 11.5–15.5)
WBC: 7.1 10*3/uL (ref 4.0–10.5)
nRBC: 0 % (ref 0.0–0.2)

## 2024-02-24 LAB — TROPONIN I (HIGH SENSITIVITY): Troponin I (High Sensitivity): 7 ng/L (ref <18)

## 2024-02-24 MED ORDER — CYCLOBENZAPRINE HCL 5 MG PO TABS: 5.0000 mg | ORAL_TABLET | Freq: Three times a day (TID) | ORAL | 0 refills | Status: AC | PRN

## 2024-02-24 NOTE — Discharge Instructions (Addendum)
 Please use ibuprofen (Motrin) up to 800 mg every 8 hours, naproxen (Naprosyn) up to 500 mg every 12 hours, and/or acetaminophen (Tylenol) up to 4 g/day for any continued pain.  Please do not use this medication regimen for longer than 7 days

## 2024-02-24 NOTE — ED Triage Notes (Signed)
 Patient to ED from [facility] for left sided CP x2 weeks. Radiates into left arm, described as burning sensation. Worse last night. Denies cardiac hx.

## 2024-02-24 NOTE — ED Provider Notes (Signed)
 [facility] Provider Note   Event Date/Time   First MD Initiated Contact with Patient 02/24/24 1111     (approximate) History  Chest Pain  HPI Tara Cabrera is a 78 y.o. female with a stated past medical history of hypertension, hyperlipidemia, COPD, and left rotator cuff injury who presents complaining of of left neck, left chest, and left shoulder pain after falling backwards onto a couch.  Patient states that she bent her neck at an angle and felt a pain shooting down her arm at that time.  Patient states that this fall was 2 weeks prior to arrival and she has had worsening pain over this time.  Patient denies any exacerbating or relieving factors for this pain.  Patient denies any worsening pain with exertion or dyspnea on exertion.  Patient also endorses burning pain down the left arm ROS: Patient currently denies any vision changes, tinnitus, difficulty speaking, facial droop, sore throat, shortness of breath, abdominal pain, nausea/vomiting/diarrhea, dysuria, or weakness/numbness/paresthesias in any extremity   Physical Exam  Triage Vital Signs: ED Triage Vitals  Encounter Vitals Group     BP 02/24/24 1030 (!) 163/82     Systolic BP Percentile --      Diastolic BP Percentile --      Pulse Rate 02/24/24 1030 64     Resp 02/24/24 1030 17     Temp 02/24/24 1030 98.3 F (36.8 C)     Temp Source 02/24/24 1030 Oral     SpO2 02/24/24 1030 96 %     Weight 02/24/24 1031 175 lb (79.4 kg)     Height 02/24/24 1031 5' 1.5" (1.562 m)     Head Circumference --      Peak Flow --      Pain Score 02/24/24 1031 5     Pain Loc --      Pain Education --      Exclude from Growth Chart --    Most recent vital signs: Vitals:   02/24/24 1030  BP: (!) 163/82  Pulse: 64  Resp: 17  Temp: 98.3 F (36.8 C)  SpO2: 96%   General: Awake, oriented x4. CV:  Good peripheral perfusion.  Resp:  Normal effort.  Abd:  No distention.  Other:  Elderly obese African-American  female resting comfortably in no acute distress.  Negative Spurling test on the left ED Results / Procedures / Treatments  Labs (all labs ordered are listed, but only abnormal results are displayed) Labs Reviewed  BASIC METABOLIC PANEL WITH GFR - Abnormal; Notable for the following components:      Result Value   Glucose, Bld 121 (*)    All other components within normal limits  CBC  TROPONIN I (HIGH SENSITIVITY)   EKG ED ECG REPORT I, Charleen Conn, the attending physician, personally viewed and interpreted this ECG. Date: 02/24/2024 EKG Time: 1034 Rate: 63 Rhythm: normal sinus rhythm QRS Axis: normal Intervals: normal ST/T Wave abnormalities: normal Narrative Interpretation: no evidence of acute ischemia RADIOLOGY ED MD interpretation: 2 view chest x-ray interpreted by me shows no evidence of acute abnormalities including no pneumonia, pneumothorax, or widened mediastinum -Agree with radiology assessment Official radiology report(s): No results found. PROCEDURES: Critical Care performed: No Procedures MEDICATIONS ORDERED IN ED: Medications - No data to display IMPRESSION / MDM / ASSESSMENT AND PLAN / ED COURSE  I reviewed the triage vital signs and the nursing notes.  The patient is on the cardiac monitor to evaluate for evidence of arrhythmia and/or significant heart rate changes. Patient's presentation is most consistent with acute presentation with potential threat to life or bodily function. This patient presents with atypical chest pain, most likely secondary to musculoskeletal injury. Differential diagnosis includes rib fracture, costochondritis, sternal fracture, cervical muscle strain. Low suspicion for ACS, acute PE (PERC negative), pericarditis / myocarditis, thoracic aortic dissection, pneumothorax, pneumonia or other acute infectious process. Presentation not consistent with other acute, emergent causes of chest pain at this time. No  indication for cardiac enzyme testing. Plan to order CXR to evaluate for acute cardiopulmonary causes.  Plan: EKG, CXR, pain control  Dispo: Discharge home with home care   FINAL CLINICAL IMPRESSION(S) / ED DIAGNOSES   Final diagnoses:  Chest pain, unspecified type  Neck pain on left side  Acute pain of left shoulder  Cervical radiculopathy   Rx / DC Orders   ED Discharge Orders          Ordered    cyclobenzaprine  (FLEXERIL ) 5 MG tablet  3 times daily PRN        02/24/24 1122           Note:  This document was prepared using Dragon voice recognition software and may include unintentional dictation errors.   Charleen Conn, MD 02/24/24 1136

## 2024-03-01 DIAGNOSIS — G720 Drug-induced myopathy: Secondary | ICD-10-CM | POA: Insufficient documentation

## 2024-05-24 ENCOUNTER — Emergency Department
Admission: EM | Admit: 2024-05-24 | Discharge: 2024-05-24 | Disposition: A | Discharge status: Home / Self Care | Attending: Emergency Medicine | Admitting: Emergency Medicine

## 2024-05-24 ENCOUNTER — Encounter: Payer: Self-pay | Admitting: Intensive Care

## 2024-05-24 ENCOUNTER — Other Ambulatory Visit: Payer: Self-pay

## 2024-05-24 DIAGNOSIS — L509 Urticaria, unspecified: Secondary | ICD-10-CM | POA: Diagnosis not present

## 2024-05-24 DIAGNOSIS — I10 Essential (primary) hypertension: Secondary | ICD-10-CM | POA: Diagnosis not present

## 2024-05-24 DIAGNOSIS — R21 Rash and other nonspecific skin eruption: Secondary | ICD-10-CM | POA: Diagnosis present

## 2024-05-24 MED ORDER — TRIAMCINOLONE ACETONIDE 0.1 % EX OINT: 1.0000 | TOPICAL_OINTMENT | Freq: Two times a day (BID) | CUTANEOUS | 0 refills | Status: AC

## 2024-05-24 MED ORDER — CETIRIZINE HCL 10 MG PO TABS: 10.0000 mg | ORAL_TABLET | Freq: Three times a day (TID) | ORAL | 2 refills | Status: AC | PRN

## 2024-05-24 NOTE — ED Provider Notes (Signed)
 [facility] Provider Note    Event Date/Time   First MD Initiated Contact with Patient 05/24/24 [phone]     (approximate)   History   No chief complaint on file.   HPI  Tara Cabrera is a 78 y.o. female with a past history of hypertension who comes ED complaining of rash.  Reports that rash has been happening sporadically for the last couple weeks, starting today after a family reunion which was held in a church basement.  No fever chills cough shortness of breath.  Rash consists of small itchy red raised areas on the skin, predominantly in flexural areas like the axilla, behind the knee, inner elbow.  Today she reports 1 on her left outer ankle and several on her right forearm.  She was initially prescribed a course of prednisone and doxycycline  which she has completed, but has new lesions today.  Reports that she notices the lesions throughout the day, not just in the morning.  Lesions typically last for a day or 2 and then resolve completely     Physical Exam   Triage Vital Signs: ED Triage Vitals  Encounter Vitals Group     BP 05/24/24 0814 (!) 153/77     Girls Systolic BP Percentile --      Girls Diastolic BP Percentile --      Boys Systolic BP Percentile --      Boys Diastolic BP Percentile --      Pulse Rate 05/24/24 0814 66     Resp 05/24/24 0814 16     Temp 05/24/24 0814 98.1 F (36.7 C)     Temp Source 05/24/24 0814 Oral     SpO2 05/24/24 0814 97 %     Weight 05/24/24 0815 174 lb (78.9 kg)     Height 05/24/24 0815 5' 1.5 (1.562 m)     Head Circumference --      Peak Flow --      Pain Score 05/24/24 0814 0     Pain Loc --      Pain Education --      Exclude from Growth Chart --     Most recent vital signs: Vitals:   05/24/24 0814  BP: (!) 153/77  Pulse: 66  Resp: 16  Temp: 98.1 F (36.7 C)  SpO2: 97%    General: Awake, no distress.  CV:  Good peripheral perfusion.  Regular rate rhythm, normal distal pulses Resp:  Normal  effort.  Clear to auscultation bilaterally Abd:  No distention.  Other:  Right forearm has 3 small discrete lesions which are red, raised, superficial, hazy margin, without skin ulceration drainage warmth or crepitus.  Size varies from a few millimeters to about 15 mm.  There is also a small 3 mm similar lesion on the left lateral foot.   ED Results / Procedures / Treatments   Labs (all labs ordered are listed, but only abnormal results are displayed) Labs Reviewed - No data to display   RADIOLOGY    PROCEDURES:  Procedures   MEDICATIONS ORDERED IN ED: Medications - No data to display   IMPRESSION / MDM / ASSESSMENT AND PLAN / ED COURSE  I reviewed the triage vital signs and the nursing notes.                              Differential diagnosis includes, but is not limited to, insect bites (mosquitoes, bedbugs), nonspecific urticaria.  Doubt drug reaction, allergic reaction, vasculitis, cellulitis/infection  Patient presents with a few small itchy red bumps.  Appearance is very suspicious for insect bites.  I do not think additional antibiotics will help at this point.  Will try as needed Zyrtec  and triamcinolone , continue primary care follow-up.     FINAL CLINICAL IMPRESSION(S) / ED DIAGNOSES   Final diagnoses:  Hives     Rx / DC Orders   ED Discharge Orders          Ordered    triamcinolone  ointment (KENALOG ) 0.1 %  2 times daily        05/24/24 0848    cetirizine  (ZYRTEC  ALLERGY) 10 MG tablet  3 times daily PRN        05/24/24 0848             Note:  This document was prepared using Dragon voice recognition software and may include unintentional dictation errors.   Viviann Pastor, MD 05/24/24 [phone]

## 2024-05-24 NOTE — ED Triage Notes (Signed)
 Patient presents with rash on right forearm and reports other intermittent areas of body. Reports the rash has been coming and going on different places of the body since beginning of July. Rash is quarter size and red in color. States it itches and burns

## 2024-06-10 ENCOUNTER — Encounter: Payer: Self-pay | Admitting: Dermatology

## 2024-06-10 ENCOUNTER — Ambulatory Visit: Admitting: Dermatology

## 2024-06-10 DIAGNOSIS — L308 Other specified dermatitis: Secondary | ICD-10-CM

## 2024-06-10 DIAGNOSIS — R21 Rash and other nonspecific skin eruption: Secondary | ICD-10-CM | POA: Diagnosis not present

## 2024-06-10 DIAGNOSIS — D721 Eosinophilia, unspecified: Secondary | ICD-10-CM | POA: Diagnosis not present

## 2024-06-10 NOTE — Patient Instructions (Addendum)

## 2024-06-10 NOTE — Progress Notes (Signed)
   Follow Up Visit   Subjective  Tara Cabrera is a 78 y.o. female who presents for the following: Rash Started July 5th - pt had hair colored and went to a cook out then the next day she started breaking out. Rash jumps around to different locations on the body like the back, arms, and legs. Currently she has a patch on the L leg. Rash is very itchy and painful and she is using TMC 0.05% cream and Cetirizine  10mg  TID, but patient continues to have itchy rash. She was seen in ED and urgent care and was prescribed Prednisone during one of those visits, but didn't notice an improvement in symptoms. No change in laundry detergents, washes, or lotions. No new illnesses or surgeries. Patient has had a lot of stress with death in the family, but rash occurred before they passed. No pets in the home. Son occasionally stays with her when he's in town, but hasn't experienced a rash. Pt checked bed for bed bugs, but hasn't seen any.  The following portions of the chart were reviewed this encounter and updated as appropriate: medications, allergies, medical history  Review of Systems:  No other skin or systemic complaints except as noted in HPI or Assessment and Plan.  Objective  Well appearing patient in no apparent distress; mood and affect are within normal limits.  A focused examination was performed of the following areas: The face, arms, and legs  Relevant exam findings are noted in the Assessment and Plan.  L lower leg Edematous pink plaque.    Assessment & Plan   RASH AND OTHER NONSPECIFIC SKIN ERUPTION L lower leg May continue TMC 0.5% cream to aa QD-BID PRN pruritus. Check for arthropods in environment at home Skin / nail biopsy - L lower leg Type of biopsy: punch   Informed consent: discussed and consent obtained   Timeout: patient name, date of birth, surgical site, and procedure verified   Procedure prep:  Patient was prepped and draped in usual sterile fashion Prep type:   Isopropyl alcohol Anesthesia: the lesion was anesthetized in a standard fashion   Anesthetic:  1% lidocaine  w/ epinephrine 1-100,000 buffered w/ 8.4% NaHCO3 Punch size:  4 mm Suture size:  4-0 Suture type: Prolene (polypropylene)   Suture removal (days):  7 Hemostasis achieved with: suture, pressure and aluminum chloride   Outcome: patient tolerated procedure well   Post-procedure details: sterile dressing applied and wound care instructions given   Dressing type: bandage and petrolatum    Specimen 1 - Surgical pathology Differential Diagnosis: arthropod bite vs urticaria vs eczema vs lupus  Check Margins: No   Return in about 1 week (around 06/17/2024) for suture removal and biopsy follow up.  LILLETTE Rosina Mayans, CMA, am acting as scribe for Boneta Sharps, MD .  Documentation: I have reviewed the above documentation for accuracy and completeness, and I agree with the above.  Boneta Sharps, MD

## 2024-06-17 ENCOUNTER — Ambulatory Visit (INDEPENDENT_AMBULATORY_CARE_PROVIDER_SITE_OTHER): Admitting: Dermatology

## 2024-06-17 ENCOUNTER — Encounter: Payer: Self-pay | Admitting: Dermatology

## 2024-06-17 DIAGNOSIS — Z4802 Encounter for removal of sutures: Secondary | ICD-10-CM

## 2024-06-17 DIAGNOSIS — Z5189 Encounter for other specified aftercare: Secondary | ICD-10-CM

## 2024-06-17 LAB — SURGICAL PATHOLOGY

## 2024-06-17 NOTE — Patient Instructions (Signed)

## 2024-06-17 NOTE — Progress Notes (Signed)
   Follow-Up Visit   Subjective  Tara Cabrera is a 78 y.o. female who presents for the following: Rash 1 wk f/u and suture removal, pt still having break outs, TMC 0.1% oint prn    The following portions of the chart were reviewed this encounter and updated as appropriate: medications, allergies, medical history  Review of Systems:  No other skin or systemic complaints except as noted in HPI or Assessment and Plan.  Objective  Well appearing patient in no apparent distress; mood and affect are within normal limits.   A focused examination was performed of the following areas: Arms, legs,   Relevant exam findings are noted in the Assessment and Plan.    Assessment & Plan   Encounter for Removal of Sutures - Incision site at the L lower leg is clean, dry and intact - Wound cleansed, sutures removed, wound cleansed and steri strips applied.  - Pathology results pending, advised patient we would call her home phone with results. - Patient advised to keep steri-strips dry until they fall off. - Scars remodel for a full year. - Once steri-strips fall off, patient can apply over-the-counter silicone scar cream each night to help with scar remodeling if desired. - Patient advised to call with any concerns or if they notice any new or changing lesions.    VISIT FOR WOUND CHECK   ENCOUNTER FOR REMOVAL OF SUTURES    Return if symptoms worsen or fail to improve.  I, Grayce Saunas, RMA, am acting as scribe for Boneta Sharps, MD .   Documentation: I have reviewed the above documentation for accuracy and completeness, and I agree with the above.  Boneta Sharps, MD

## 2024-06-21 ENCOUNTER — Telehealth: Payer: Self-pay | Admitting: Dermatology

## 2024-06-21 ENCOUNTER — Ambulatory Visit: Payer: Self-pay | Admitting: Dermatology

## 2024-06-21 NOTE — Telephone Encounter (Signed)
 Shared biopsy result by phone. Biopsy shows arthropod bite vs drug hypersensitivity. Given the pattern of bilateral asymmetric lesions of short duration affecting seemingly randomly locations on trunk and extremities, pattern is more suggestive of arthropod bites rather than drug reaction. Patient doesn't think it is from bug bites. No known biting arthropods in environment. No new drugs. Patient lives with son and will ask if he has lesions. Another possibility is scabies. Discussed scabies and offered empiric ivermectin. Difficult to confirm scabies (may catch in a scraping or biopsy). Patient opts to see PCP next week to get more answers.  Rash started after hair dye and family reunion July 4. Mentioned possibility that patient contracted scabies from family member. Patient does not think so. All questions answered.

## 2024-06-28 ENCOUNTER — Telehealth: Payer: Self-pay

## 2024-06-28 NOTE — Telephone Encounter (Signed)
 Patient called concerned about her biopsy site not healing well, she think it look like a hole in this area, she does not have mychart to send over a photo she would like an appt this week for recheck.

## 2024-06-29 ENCOUNTER — Encounter: Payer: Self-pay | Admitting: Dermatology

## 2024-06-29 ENCOUNTER — Ambulatory Visit: Admitting: Dermatology

## 2024-06-29 DIAGNOSIS — S81852A Open bite, left lower leg, initial encounter: Secondary | ICD-10-CM

## 2024-06-29 DIAGNOSIS — Z5189 Encounter for other specified aftercare: Secondary | ICD-10-CM

## 2024-06-29 NOTE — Patient Instructions (Signed)

## 2024-06-29 NOTE — Progress Notes (Signed)
   Follow-Up Visit   Subjective  Benedicta L Insco is a 78 y.o. female who presents for the following: Recheck bx site on the L lower leg, pt states, not healing and painful. She is concerned that there's a hole where biopsy was performed. Pt states that rash has improved and she doesn't think the issue was bug bites.  The following portions of the chart were reviewed this encounter and updated as appropriate: medications, allergies, medical history  Review of Systems:  No other skin or systemic complaints except as noted in HPI or Assessment and Plan.  Objective  Well appearing patient in no apparent distress; mood and affect are within normal limits.   A focused examination was performed of the following areas: the legs   Relevant exam findings are noted in the Assessment and Plan.    Assessment & Plan   Healing wound from punch bx (insect bite reaction) - L post calf  Appears to be healing well and not infected. Continue wound care daily, cleaning gently with warm water soap, patting dry, and covering with Vaseline and a bandage daily until healed.  VISIT FOR WOUND CHECK    Return if symptoms worsen or fail to improve.  LILLETTE Rosina Mayans, CMA, am acting as scribe for Boneta Sharps, MD .   Documentation: I have reviewed the above documentation for accuracy and completeness, and I agree with the above.  Boneta Sharps, MD

## 2024-10-18 ENCOUNTER — Other Ambulatory Visit: Payer: Self-pay | Admitting: Internal Medicine

## 2024-10-18 DIAGNOSIS — Z1231 Encounter for screening mammogram for malignant neoplasm of breast: Secondary | ICD-10-CM

## 2024-11-18 ENCOUNTER — Ambulatory Visit
Admission: RE | Admit: 2024-11-18 | Discharge: 2024-11-18 | Disposition: A | Discharge status: Home / Self Care | Source: Ambulatory Visit | Attending: Internal Medicine | Admitting: Internal Medicine

## 2024-11-18 DIAGNOSIS — Z1231 Encounter for screening mammogram for malignant neoplasm of breast: Secondary | ICD-10-CM | POA: Insufficient documentation
